# Patient Record
Sex: Male | Born: 1970 | Hispanic: No | Marital: Married | Smoking: Never smoker
Health system: Southern US, Community
[De-identification: ages and names within clinical notes are randomized; demographics above are authoritative.]

---

## 2001-05-26 ENCOUNTER — Emergency Department (HOSPITAL_COMMUNITY): Admission: EM | Admit: 2001-05-26 | Discharge: 2001-05-26 | Payer: Self-pay

## 2002-10-21 ENCOUNTER — Emergency Department (HOSPITAL_COMMUNITY): Admission: EM | Admit: 2002-10-21 | Discharge: 2002-10-22 | Payer: Self-pay

## 2003-02-03 ENCOUNTER — Emergency Department (HOSPITAL_COMMUNITY): Admission: EM | Admit: 2003-02-03 | Discharge: 2003-02-04 | Payer: Self-pay

## 2004-09-30 ENCOUNTER — Other Ambulatory Visit: Payer: Self-pay

## 2004-09-30 ENCOUNTER — Emergency Department: Payer: Self-pay | Admitting: General Practice

## 2008-03-22 ENCOUNTER — Emergency Department: Payer: Self-pay | Admitting: Emergency Medicine

## 2014-02-14 ENCOUNTER — Emergency Department (HOSPITAL_COMMUNITY): Payer: Self-pay

## 2014-02-14 ENCOUNTER — Encounter (HOSPITAL_COMMUNITY): Payer: Self-pay | Admitting: Emergency Medicine

## 2014-02-14 ENCOUNTER — Emergency Department (HOSPITAL_COMMUNITY)
Admission: EM | Admit: 2014-02-14 | Discharge: 2014-02-14 | Disposition: A | Discharge status: Home / Self Care | Payer: Self-pay | Attending: Emergency Medicine | Admitting: Emergency Medicine

## 2014-02-14 DIAGNOSIS — M795 Residual foreign body in soft tissue: Secondary | ICD-10-CM

## 2014-02-14 DIAGNOSIS — Y9389 Activity, other specified: Secondary | ICD-10-CM | POA: Insufficient documentation

## 2014-02-14 DIAGNOSIS — S70351A Superficial foreign body, right thigh, initial encounter: Secondary | ICD-10-CM | POA: Insufficient documentation

## 2014-02-14 DIAGNOSIS — W294XXA Contact with nail gun, initial encounter: Secondary | ICD-10-CM | POA: Insufficient documentation

## 2014-02-14 DIAGNOSIS — Y9289 Other specified places as the place of occurrence of the external cause: Secondary | ICD-10-CM | POA: Insufficient documentation

## 2014-02-14 DIAGNOSIS — Y998 Other external cause status: Secondary | ICD-10-CM | POA: Insufficient documentation

## 2014-02-14 MED ORDER — OXYCODONE-ACETAMINOPHEN 5-325 MG PO TABS: 1.0000 | ORAL_TABLET | Freq: Once | ORAL | Status: DC

## 2014-02-14 MED ORDER — LIDOCAINE HCL (PF) 1 % IJ SOLN
5.0000 mL | Freq: Once | INTRAMUSCULAR | Status: AC
  Administered 2014-02-14: 5 mL
  Filled 2014-02-14: qty 5

## 2014-02-14 MED ORDER — CEPHALEXIN 500 MG PO CAPS: 500.0000 mg | ORAL_CAPSULE | Freq: Four times a day (QID) | ORAL | Status: DC

## 2014-02-14 MED ORDER — HYDROMORPHONE HCL 1 MG/ML IJ SOLN
1.0000 mg | Freq: Once | INTRAMUSCULAR | Status: AC
Start: 2014-02-14 — End: 2014-02-14
  Administered 2014-02-14: 1 mg via INTRAMUSCULAR
  Filled 2014-02-14: qty 1

## 2014-02-14 NOTE — Discharge Instructions (Signed)
Watch for signs of infection. Keep the wound clean with soap and water. Return for fevers or increasing pain. Take 3 days of the antibiotic.

## 2014-02-14 NOTE — ED Notes (Signed)
Pt with approx 2 inch long nail in right upper leg from nail gun; pt sts last TD was 5 years ago
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
## 2015-02-03 ENCOUNTER — Encounter (HOSPITAL_COMMUNITY): Payer: Self-pay | Admitting: Emergency Medicine

## 2015-02-03 ENCOUNTER — Emergency Department (HOSPITAL_COMMUNITY): Payer: No Typology Code available for payment source

## 2015-02-03 ENCOUNTER — Emergency Department (HOSPITAL_COMMUNITY)
Admission: EM | Admit: 2015-02-03 | Discharge: 2015-02-03 | Disposition: A | Discharge status: Home / Self Care | Payer: No Typology Code available for payment source | Attending: Emergency Medicine | Admitting: Emergency Medicine

## 2015-02-03 DIAGNOSIS — S6991XA Unspecified injury of right wrist, hand and finger(s), initial encounter: Secondary | ICD-10-CM

## 2015-02-03 DIAGNOSIS — Y998 Other external cause status: Secondary | ICD-10-CM | POA: Insufficient documentation

## 2015-02-03 DIAGNOSIS — Y92410 Unspecified street and highway as the place of occurrence of the external cause: Secondary | ICD-10-CM | POA: Insufficient documentation

## 2015-02-03 DIAGNOSIS — Y9389 Activity, other specified: Secondary | ICD-10-CM | POA: Insufficient documentation

## 2015-02-03 DIAGNOSIS — Z792 Long term (current) use of antibiotics: Secondary | ICD-10-CM | POA: Insufficient documentation
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
## 2015-12-02 ENCOUNTER — Emergency Department (HOSPITAL_COMMUNITY): Payer: Self-pay

## 2015-12-02 ENCOUNTER — Encounter (HOSPITAL_COMMUNITY): Payer: Self-pay | Admitting: *Deleted

## 2015-12-02 ENCOUNTER — Emergency Department (HOSPITAL_COMMUNITY)
Admission: EM | Admit: 2015-12-02 | Discharge: 2015-12-02 | Disposition: A | Discharge status: Home / Self Care | Payer: Self-pay | Attending: Emergency Medicine | Admitting: Emergency Medicine

## 2015-12-02 DIAGNOSIS — S6992XA Unspecified injury of left wrist, hand and finger(s), initial encounter: Secondary | ICD-10-CM

## 2015-12-02 DIAGNOSIS — W268XXA Contact with other sharp object(s), not elsewhere classified, initial encounter: Secondary | ICD-10-CM | POA: Insufficient documentation

## 2015-12-02 DIAGNOSIS — Y999 Unspecified external cause status: Secondary | ICD-10-CM | POA: Insufficient documentation

## 2015-12-02 DIAGNOSIS — Y939 Activity, unspecified: Secondary | ICD-10-CM | POA: Insufficient documentation

## 2015-12-02 DIAGNOSIS — Y929 Unspecified place or not applicable: Secondary | ICD-10-CM | POA: Insufficient documentation

## 2015-12-02 DIAGNOSIS — S61311A Laceration without foreign body of left index finger with damage to nail, initial encounter: Secondary | ICD-10-CM | POA: Insufficient documentation

## 2015-12-02 MED ORDER — NAPROXEN 500 MG PO TABS: 500.0000 mg | ORAL_TABLET | Freq: Two times a day (BID) | ORAL | 0 refills | Status: DC

## 2015-12-02 MED ORDER — CEPHALEXIN 250 MG PO CAPS
500.0000 mg | ORAL_CAPSULE | Freq: Once | ORAL | Status: AC
  Administered 2015-12-02: 500 mg via ORAL
  Filled 2015-12-02: qty 2

## 2015-12-02 MED ORDER — HYDROCODONE-ACETAMINOPHEN 5-325 MG PO TABS: 1.0000 | ORAL_TABLET | Freq: Four times a day (QID) | ORAL | 0 refills | Status: DC | PRN

## 2015-12-02 MED ORDER — CEPHALEXIN 500 MG PO CAPS: 500.0000 mg | ORAL_CAPSULE | Freq: Four times a day (QID) | ORAL | 0 refills | Status: DC

## 2015-12-02 MED ORDER — BUPIVACAINE HCL (PF) 0.5 % IJ SOLN
10.0000 mL | Freq: Once | INTRAMUSCULAR | Status: AC
  Administered 2015-12-02: 10 mL
  Filled 2015-12-02: qty 10

## 2015-12-02 MED ORDER — LIDOCAINE HCL (PF) 1 % IJ SOLN
5.0000 mL | Freq: Once | INTRAMUSCULAR | Status: AC
  Administered 2015-12-02: 5 mL
  Filled 2015-12-02: qty 5

## 2015-12-02 NOTE — ED Triage Notes (Signed)
Pt cut left index finger with a skill saw. Bleeding controlled.

## 2015-12-02 NOTE — ED Notes (Signed)
Pt departed in NAD, refused use of wheelchair.  

## 2015-12-02 NOTE — ED Notes (Signed)
PA at bedside.

## 2015-12-02 NOTE — ED Provider Notes (Signed)
[facility]-EMERGENCY DEPT Provider Note   CSN: [id number] Arrival date & time: 12/02/15  1600  By signing my name below, I, Phillis Haggis, attest that this documentation has been prepared under the direction and in the presence of [facility], NP-C. Electronically Signed: Phillis Haggis, ED Scribe. 12/02/15. 5:02 PM.  History   Chief Complaint Chief Complaint  Patient presents with  . Finger Injury   The history is provided by the patient. No language interpreter was used.  Laceration   The incident occurred 1 to 2 hours ago. The laceration is located on the left hand. The laceration mechanism was a a metal edge. The pain is moderate. The pain has been constant since onset. His tetanus status is UTD.  HPI Comments: Derrick Carlson is a 45 y.o. male who presents to the Emergency Department complaining of a deep laceration to the left index finger onset two hours ago. Pt reports that he was using a skill saw when it cut through the nailbed of the finger. Bleeding is currently controlled. He is utd on tdap. Pt is right hand dominant. He denies numbness or weakness.  History reviewed. No pertinent past medical history.  There are no active problems to display for this patient.   History reviewed. No pertinent surgical history.   Home Medications    Prior to Admission medications   Medication Sig Start Date End Date Taking? Authorizing Provider  cephALEXin (KEFLEX) 500 MG capsule Take 1 capsule (500 mg total) by mouth 4 (four) times daily. 12/02/15   Anallely Rosell Orlene Och, NP  HYDROcodone-acetaminophen (NORCO) 5-325 MG tablet Take 1 tablet by mouth every 6 (six) hours as needed. 12/02/15   Dulcie Gammon Orlene Och, NP  naproxen (NAPROSYN) 500 MG tablet Take 1 tablet (500 mg total) by mouth 2 (two) times daily. 12/02/15   Kimmy Totten Orlene Och, NP    Family History History reviewed. No pertinent family history.  Social History Social History  Substance Use Topics  . Smoking status: Never Smoker  . Smokeless tobacco:  Never Used  . Alcohol use Yes     Comment: sometimes     Allergies   Review of patient's allergies indicates no known allergies.   Review of Systems Review of Systems  Musculoskeletal: Positive for arthralgias.  Skin: Positive for wound.  Neurological: Negative for weakness and numbness.   Physical Exam Updated Vital Signs BP 140/90 (BP Location: Right Arm)   Pulse 84   Temp 98.6 F (37 C) (Oral)   Resp 18   Ht 5\' 6"  (1.676 m)   Wt 86.2 kg   SpO2 95%   BMI 30.67 kg/m   Physical Exam  Constitutional: He is oriented to person, place, and time. He appears well-developed and well-nourished.  HENT:  Head: Normocephalic and atraumatic.  Eyes: Conjunctivae and EOM are normal. Pupils are equal, round, and reactive to light.  Neck: Normal range of motion. Neck supple.  Musculoskeletal:  Left index finger: There is a laceration and partial fingertip amputation that included the nail and through the nailbed. He has normal strength, full ROM.   Neurological: He is alert and oriented to person, place, and time.  Skin: Skin is warm and dry.  Psychiatric: He has a normal mood and affect. His behavior is normal.  Nursing note and vitals reviewed.  ED Treatments / Results  DIAGNOSTIC STUDIES: Oxygen Saturation is 95% on RA, normal by my interpretation.    COORDINATION OF CARE: 5:00 PM-Discussed treatment plan which includes digital block and consult  to hand with pt at bedside and pt agreed to plan.   7:30 PM-digital block performed and successful. Area was scrubbed with betadine brush and irrigated with 800 CCs of sterile saline. Xeroform dressing was placed by me at this time. Consult to hand surgeon on call.  8:06 PM-consult with Dr.Weingold. He requests that the  Pt call to be seen in his office tomorrow.   Labs (all labs ordered are listed, but only abnormal results are displayed) Labs Reviewed - No data to display  Radiology Dg Hand Complete Left  Result Date:  12/02/2015 CLINICAL DATA:  Caught second digit in steel solid with laceration distally EXAM: LEFT HAND - COMPLETE 3+ VIEW COMPARISON:  None. FINDINGS: There does appear to be soft tissue defect involving the distal aspect of the left second digit, but no fracture or involvement of bone is seen. Unfortunately the lateral view does not allow visualization of the second digit with considerable bony overlap. The radiocarpal joint space appears normal. MCP, PIP, DIP joints appear normal. IMPRESSION: No fracture or opaque foreign body. The lateral view is suboptimal for assessment of the distal phalanx of the left second digit. Electronically Signed   By: Dwyane DeePaul  Barry M.D.   On: 12/02/2015 16:49   Dg Finger Index Left  Result Date: 12/02/2015 CLINICAL DATA:  Recent saw injury to the second digit, initial encounter EXAM: LEFT INDEX FINGER 2+V COMPARISON:  Films from earlier in the same day FINDINGS: Bony structures are within normal limits. Soft tissue irregularity is noted distally consistent with the recent injury. Questionable density is noted within the distal soft tissues similar to that seen on prior exam. Second density is noted adjacent to the proximal interphalangeal joint likely of a chronic nature. IMPRESSION: Questionable foreign body in the distal soft tissues of the second digit. No bony abnormality is seen. Foreign body adjacent to the second PIP joint dorsally likely related to prior injury. Electronically Signed   By: Alcide CleverMark  Lukens M.D.   On: 12/02/2015 17:35    Procedures .Nerve Block Date/Time: 12/02/2015 5:01 PM Performed by: Janne NapoleonNEESE, Mykhia Danish M Authorized by: Janne NapoleonNEESE, Mathews Stuhr M   Consent:    Consent obtained:  Verbal Indications:    Indications:  Pain relief Location:    Body area:  Upper extremity   Upper extremity nerve:  Metacarpal   Laterality:  Left Skin anesthesia (see MAR for exact dosages):    Skin anesthesia method:  None Procedure details (see MAR for exact dosages):    Anesthetic  injected:  Bupivacaine 0.5% w/o epi and lidocaine 1% w/o epi   Paresthesia:  None Post-procedure details:    Patient tolerance of procedure:  Tolerated well, no immediate complications    (including critical care time)  Medications Ordered in ED Medications  bupivacaine (MARCAINE) 0.5 % injection 10 mL (10 mLs Infiltration Given by Other 12/02/15 1753)  lidocaine (PF) (XYLOCAINE) 1 % injection 5 mL (5 mLs Infiltration Given by Other 12/02/15 1753)  cephALEXin (KEFLEX) capsule 500 mg (500 mg Oral Given 12/02/15 2031)   Initial Impression / Assessment and Plan / ED Course  I have reviewed the triage vital signs and the nursing notes.  Pertinent labs & imaging results that were available during my care of the patient were reviewed by me and considered in my medical decision making (see chart for details).  Clinical Course    Final Clinical Impressions(s) / ED Diagnoses  45 y.o. male with injury to his left index finger due to saw injury stable  for d/c to f/ with Dr. Mina Marble in the office in the morning. Discussed with the patient and all questioned fully answered. He voices understanding and agrees with plan.    Final diagnoses:  Injury of tip of finger of left hand, initial encounter   I personally performed the services described in this documentation, which was scribed in my presence. The recorded information has been reviewed and is accurate.   New Prescriptions Discharge Medication List as of 12/02/2015  8:21 PM    START taking these medications   Details  HYDROcodone-acetaminophen (NORCO) 5-325 MG tablet Take 1 tablet by mouth every 6 (six) hours as needed., Starting Wed 12/02/2015, Print    naproxen (NAPROSYN) 500 MG tablet Take 1 tablet (500 mg total) by mouth 2 (two) times daily., Starting Wed 12/02/2015, [address] [place], NP 12/03/15 1610    Alvira Monday, MD 12/03/15 1310

## 2015-12-02 NOTE — Discharge Instructions (Signed)
Call Dr. Ronie SpiesWeingold's office in the morning and tell them that you were evaluated in the ED and that we spoke with Dr. Mina MarbleWeingold and he wants you to come to the office for recheck 12/03/15. (tomorrow) Do not drive while taking the narcotic pain medication as it will make you sleepy.

## 2015-12-21 ENCOUNTER — Encounter (HOSPITAL_COMMUNITY): Payer: Self-pay | Admitting: Emergency Medicine

## 2015-12-21 ENCOUNTER — Emergency Department (HOSPITAL_COMMUNITY)
Admission: EM | Admit: 2015-12-21 | Discharge: 2015-12-21 | Disposition: A | Discharge status: Home / Self Care | Payer: Self-pay | Attending: Emergency Medicine | Admitting: Emergency Medicine

## 2015-12-21 DIAGNOSIS — R112 Nausea with vomiting, unspecified: Secondary | ICD-10-CM | POA: Insufficient documentation

## 2015-12-21 DIAGNOSIS — R1013 Epigastric pain: Secondary | ICD-10-CM | POA: Insufficient documentation

## 2015-12-21 LAB — DIFFERENTIAL
BASOS ABS: 0 10*3/uL (ref 0.0–0.1)
Basophils Relative: 1 %
EOS PCT: 14 %
Eosinophils Absolute: 1.2 10*3/uL — ABNORMAL HIGH (ref 0.0–0.7)
LYMPHS ABS: 2.4 10*3/uL (ref 0.7–4.0)
LYMPHS PCT: 29 %
Monocytes Absolute: 0.8 10*3/uL (ref 0.1–1.0)
Monocytes Relative: 10 %
NEUTROS PCT: 46 %
Neutro Abs: 3.8 10*3/uL (ref 1.7–7.7)

## 2015-12-21 LAB — COMPREHENSIVE METABOLIC PANEL
ALBUMIN: 4.2 g/dL (ref 3.5–5.0)
ALK PHOS: 89 U/L (ref 38–126)
ALT: 69 U/L — ABNORMAL HIGH (ref 17–63)
ANION GAP: 10 (ref 5–15)
AST: 35 U/L (ref 15–41)
BUN: 16 mg/dL (ref 6–20)
CALCIUM: 9.3 mg/dL (ref 8.9–10.3)
CHLORIDE: 101 mmol/L (ref 101–111)
CO2: 25 mmol/L (ref 22–32)
Creatinine, Ser: 1.16 mg/dL (ref 0.61–1.24)
GFR calc non Af Amer: >60 mL/min (ref >60)
Glucose, Bld: 108 mg/dL — ABNORMAL HIGH (ref 65–99)
POTASSIUM: 4 mmol/L (ref 3.5–5.1)
SODIUM: 136 mmol/L (ref 135–145)
Total Bilirubin: 0.5 mg/dL (ref 0.3–1.2)
Total Protein: 7.3 g/dL (ref 6.5–8.1)

## 2015-12-21 LAB — CBC
HEMATOCRIT: 47.2 % (ref 39.0–52.0)
HEMOGLOBIN: 16.2 g/dL (ref 13.0–17.0)
MCH: 30.2 pg (ref 26.0–34.0)
MCHC: 34.3 g/dL (ref 30.0–36.0)
MCV: 88.1 fL (ref 78.0–100.0)
Platelets: 247 10*3/uL (ref 150–400)
RBC: 5.36 MIL/uL (ref 4.22–5.81)
RDW: 13.7 % (ref 11.5–15.5)
WBC: 8.6 10*3/uL (ref 4.0–10.5)

## 2015-12-21 LAB — URINALYSIS, ROUTINE W REFLEX MICROSCOPIC
Bilirubin Urine: NEGATIVE
Glucose, UA: NEGATIVE mg/dL
HGB URINE DIPSTICK: NEGATIVE
Ketones, ur: NEGATIVE mg/dL
Leukocytes, UA: NEGATIVE
Nitrite: NEGATIVE
PH: 6 (ref 5.0–8.0)
Protein, ur: NEGATIVE mg/dL
SPECIFIC GRAVITY, URINE: 1.021 (ref 1.005–1.030)

## 2015-12-21 LAB — LIPASE, BLOOD: LIPASE: 21 U/L (ref 11–51)

## 2015-12-21 MED ORDER — ONDANSETRON HCL 4 MG/2ML IJ SOLN
4.0000 mg | Freq: Once | INTRAMUSCULAR | Status: AC
  Administered 2015-12-21: 4 mg via INTRAVENOUS
  Filled 2015-12-21: qty 2

## 2015-12-21 MED ORDER — MORPHINE SULFATE (PF) 4 MG/ML IV SOLN
4.0000 mg | Freq: Once | INTRAVENOUS | Status: AC
Start: 2015-12-21 — End: 2015-12-21
  Administered 2015-12-21: 4 mg via INTRAVENOUS
  Filled 2015-12-21: qty 1

## 2015-12-21 MED ORDER — OXYCODONE-ACETAMINOPHEN 5-325 MG PO TABS: 1.0000 | ORAL_TABLET | ORAL | 0 refills | Status: DC | PRN

## 2015-12-21 MED ORDER — PANTOPRAZOLE SODIUM 40 MG PO TBEC
40.0000 mg | DELAYED_RELEASE_TABLET | Freq: Once | ORAL | Status: AC
  Administered 2015-12-21: 40 mg via ORAL
  Filled 2015-12-21: qty 1

## 2015-12-21 MED ORDER — MORPHINE SULFATE (PF) 4 MG/ML IV SOLN
4.0000 mg | Freq: Once | INTRAVENOUS | Status: AC
  Administered 2015-12-21: 4 mg via INTRAVENOUS
  Filled 2015-12-21: qty 1

## 2015-12-21 MED ORDER — PANTOPRAZOLE SODIUM 20 MG PO TBEC: 20.0000 mg | DELAYED_RELEASE_TABLET | Freq: Every day | ORAL | 0 refills | Status: DC

## 2015-12-21 MED ORDER — GI COCKTAIL ~~LOC~~
30.0000 mL | Freq: Once | ORAL | Status: AC
  Administered 2015-12-21: 30 mL via ORAL
  Filled 2015-12-21: qty 30

## 2015-12-21 MED ORDER — ONDANSETRON HCL 4 MG PO TABS: 4.0000 mg | ORAL_TABLET | Freq: Four times a day (QID) | ORAL | 0 refills | Status: DC | PRN

## 2015-12-21 NOTE — Discharge Instructions (Signed)
Return to the ED if pain is getting worse.

## 2015-12-21 NOTE — ED Triage Notes (Signed)
Pt presents to ER from home with upper abd pain with vomiting since 8p last night; pt states emesis was yellow; pt denies fever, diarrhea, urinary symptoms;

## 2015-12-21 NOTE — ED Notes (Signed)
Pt and family understood dc material. NAD noted. SCripts given at dc 

## 2015-12-21 NOTE — ED Notes (Signed)
pts family came out and stated he felt better. MD aware

## 2015-12-21 NOTE — ED Provider Notes (Signed)
[facility] Provider Note   CSN: [id number] Arrival date & time: 12/21/15  [id number]  By signing my name below, I, Doreatha MartinEva Mathews, attest that this documentation has been prepared under the direction and in the presence of Dione Boozeavid Prim Morace, MD. Electronically Signed: Doreatha MartinEva Mathews, ED Scribe. 12/21/15. 2:59 AM.     History   Chief Complaint Chief Complaint  Patient presents with  . Abdominal Pain  . Emesis    HPI Derrick Carlson is a 45 y.o. male who presents to the Emergency Department complaining of moderate, 8/10 epigastric abdominal pain onset at 8 pm last night with associated nausea, vomiting. Pt states his pain is worsened with certain movements. No alleviating factors noted. No known sick contacts with similar symptoms. He denies constipation, diarrhea, fever, chills, diaphoresis. Pt is not currently followed by a PCP.    The history is provided by the patient. No language interpreter was used.    History reviewed. No pertinent past medical history.  There are no active problems to display for this patient.   History reviewed. No pertinent surgical history.     Home Medications    Prior to Admission medications   Medication Sig Start Date End Date Taking? Authorizing Provider  cephALEXin (KEFLEX) 500 MG capsule Take 1 capsule (500 mg total) by mouth 4 (four) times daily. 12/02/15   Hope Orlene OchM Neese, NP  HYDROcodone-acetaminophen (NORCO) 5-325 MG tablet Take 1 tablet by mouth every 6 (six) hours as needed. 12/02/15   Hope Orlene OchM Neese, NP  naproxen (NAPROSYN) 500 MG tablet Take 1 tablet (500 mg total) by mouth 2 (two) times daily. 12/02/15   Hope Orlene OchM Neese, NP    Family History History reviewed. No pertinent family history.  Social History Social History  Substance Use Topics  . Smoking status: Never Smoker  . Smokeless tobacco: Never Used  . Alcohol use Yes     Comment: sometimes     Allergies   Review of patient's allergies indicates no known allergies.   Review of  Systems Review of Systems  Constitutional: Negative for chills, diaphoresis and fever.  Gastrointestinal: Positive for abdominal pain, nausea and vomiting. Negative for constipation and diarrhea.  All other systems reviewed and are negative.    Physical Exam Updated Vital Signs BP (!) 146/108 (BP Location: Right Arm)   Pulse 62   Temp 98 F (36.7 C) (Oral)   Resp 18   Ht 5\' 6"  (1.676 m)   Wt 175 lb (79.4 kg)   SpO2 96%   BMI 28.25 kg/m   Physical Exam  Constitutional: He is oriented to person, place, and time. He appears well-developed and well-nourished.  HENT:  Head: Normocephalic and atraumatic.  Eyes: EOM are normal. Pupils are equal, round, and reactive to light.  Neck: Normal range of motion. Neck supple. No JVD present.  Cardiovascular: Normal rate, regular rhythm and normal heart sounds.   No murmur heard. Pulmonary/Chest: Effort normal and breath sounds normal. He has no wheezes. He has no rales. He exhibits no tenderness.  Abdominal: Soft. He exhibits no distension and no mass. There is tenderness.  Mild epigastric tenderness. Bowel sounds decreased.   Musculoskeletal: Normal range of motion. He exhibits no edema.  Lymphadenopathy:    He has no cervical adenopathy.  Neurological: He is alert and oriented to person, place, and time. No cranial nerve deficit. He exhibits normal muscle tone. Coordination normal.  Skin: Skin is warm and dry. No rash noted.  Psychiatric: He has a normal  mood and affect. His behavior is normal. Judgment and thought content normal.  Nursing note and vitals reviewed.    ED Treatments / Results   DIAGNOSTIC STUDIES: Oxygen Saturation is 96% on RA, adequate by my interpretation.    COORDINATION OF CARE: 2:57 AM Discussed treatment plan with pt at bedside which includes lab work and pt agreed to plan.    Labs (all labs ordered are listed, but only abnormal results are displayed) Labs Reviewed  COMPREHENSIVE METABOLIC PANEL -  Abnormal; Notable for the following:       Result Value   Glucose, Bld 108 (*)    ALT 69 (*)    All other components within normal limits  DIFFERENTIAL - Abnormal; Notable for the following:    Eosinophils Absolute 1.2 (*)    All other components within normal limits  LIPASE, BLOOD  CBC  URINALYSIS, ROUTINE W REFLEX MICROSCOPIC (NOT AT [facility])    Procedures Procedures (including critical care time)  Medications Ordered in ED Medications  ondansetron (ZOFRAN) injection 4 mg (4 mg Intravenous Given 12/21/15 0309)  gi cocktail (Maalox,Lidocaine,Donnatal) (30 mLs Oral Given 12/21/15 0309)  morphine 4 MG/ML injection 4 mg (4 mg Intravenous Given 12/21/15 0408)  morphine 4 MG/ML injection 4 mg (4 mg Intravenous Given 12/21/15 0618)  pantoprazole (PROTONIX) EC tablet 40 mg (40 mg Oral Given 12/21/15 0618)     Initial Impression / Assessment and Plan / ED Course  I have reviewed the triage vital signs and the nursing notes.  Pertinent lab results that were available during my care of the patient were reviewed by me and considered in my medical decision making (see chart for details).  Clinical Course   Nausea, vomiting, epigastric pain. Symptoms are suggestive of viral enteritis. There may be some component of GERD. Old records are reviewed, and he has no relevant past visits. He is given ondansetron and a GI cocktail with slight improvement but it continued to complain of epigastric pain. He was given 2 doses of morphine with significant relief of pain. Laboratory workup is reassuring. He is discharged with prescriptions for pantoprazole and ondansetron. Also given a prescription for a small number of oxycodone-acetaminophen. Return precautions given.  Final Clinical Impressions(s) / ED Diagnoses   Final diagnoses:  Epigastric pain  Non-intractable vomiting with nausea, unspecified vomiting type    New Prescriptions Discharge Medication List as of 12/21/2015  6:53 AM    START  taking these medications   Details  ondansetron (ZOFRAN) 4 MG tablet Take 1 tablet (4 mg total) by mouth every 6 (six) hours as needed for nausea or vomiting., Starting Mon 12/21/2015, Print    oxyCODONE-acetaminophen (PERCOCET) 5-325 MG tablet Take 1 tablet by mouth every 4 (four) hours as needed for moderate pain., Starting Mon 12/21/2015, Print    pantoprazole (PROTONIX) 20 MG tablet Take 1 tablet (20 mg total) by mouth daily., Starting Mon 12/21/2015, Print        I personally performed the services described in this documentation, which was scribed in my presence. The recorded information has been reviewed and is accurate.       Dione Booze, MD 12/21/15 [phone]

## 2017-07-28 ENCOUNTER — Emergency Department (HOSPITAL_COMMUNITY)
Admission: EM | Admit: 2017-07-28 | Discharge: 2017-07-28 | Disposition: A | Discharge status: Home / Self Care | Payer: Self-pay | Attending: Emergency Medicine | Admitting: Emergency Medicine

## 2017-07-28 ENCOUNTER — Encounter (HOSPITAL_COMMUNITY): Payer: Self-pay | Admitting: Emergency Medicine

## 2017-07-28 DIAGNOSIS — B029 Zoster without complications: Secondary | ICD-10-CM | POA: Insufficient documentation

## 2017-07-28 MED ORDER — ACYCLOVIR 400 MG PO TABS: 400.0000 mg | ORAL_TABLET | Freq: Every day | ORAL | 0 refills | Status: DC

## 2017-07-28 MED ORDER — NAPROXEN 375 MG PO TABS: 375.0000 mg | ORAL_TABLET | Freq: Two times a day (BID) | ORAL | 0 refills | Status: DC

## 2017-07-28 NOTE — ED Triage Notes (Signed)
Reports a rash to left buttock for the last three days.  Reports small blister like area that busted and is painful.  Taking tylenol with no relief of pain.

## 2017-07-28 NOTE — ED Provider Notes (Signed)
[facility] EMERGENCY DEPARTMENT Provider Note   CSN: [id number] Arrival date & time: 07/28/17  2129     History   Chief Complaint Chief Complaint  Patient presents with  . Rash    HPI Derrick Carlson Derrick Carlson is a 47 y.o. male with no significant past medical history presents emergency department today for rash to his left buttock over the last 3 days.  Patient reports that 3 days ago he noticed a rash on the left side of his buttocks that does not cross midline and reports that they were vesicular-like blisters that have now popped and scabbed over.  He notes that the area is not pruritic. He has not tried anything for this. Denies fever, chills, contacts with persons with similar rash, or any changes in lotions/soaps/detergents, exposure to animal or plant irritants. Denies swelling or purulent discharge. No new medications. No recent travel. No recent tick bites. No involvement to palms/soles or between webspaces. Patient does not have history of  Immunocompromise.   HPI  History reviewed. No pertinent past medical history.  There are no active problems to display for this patient.   History reviewed. No pertinent surgical history.      Home Medications    Prior to Admission medications   Medication Sig Start Date End Date Taking? Authorizing Provider  acyclovir (ZOVIRAX) 400 MG tablet Take 1 tablet (400 mg total) by mouth 5 (five) times daily. 07/28/17   Kerline Trahan, Elmer Sow, PA-C  naproxen (NAPROSYN) 375 MG tablet Take 1 tablet (375 mg total) by mouth 2 (two) times daily. 07/28/17   Adream Parzych, Elmer Sow, PA-C  ondansetron (ZOFRAN) 4 MG tablet Take 1 tablet (4 mg total) by mouth every 6 (six) hours as needed for nausea or vomiting. 12/21/15   Dione Booze, MD  oxyCODONE-acetaminophen (PERCOCET) 5-325 MG tablet Take 1 tablet by mouth every 4 (four) hours as needed for moderate pain. 12/21/15   Dione Booze, MD  pantoprazole (PROTONIX) 20 MG tablet Take 1 tablet (20 mg  total) by mouth daily. 12/21/15   Dione Booze, MD    Family History No family history on file.  Social History Social History   Tobacco Use  . Smoking status: Never Smoker  . Smokeless tobacco: Never Used  Substance Use Topics  . Alcohol use: Yes    Comment: sometimes  . Drug use: No     Allergies   Patient has no known allergies.   Review of Systems Review of Systems  All other systems reviewed and are negative.    Physical Exam Updated Vital Signs BP (!) 135/99 (BP Location: Right Arm)   Pulse (!) 59   Temp 98.3 F (36.8 C) (Oral)   Resp 18   Ht  (1.626 m)   Wt 86.2 kg (190 lb)   SpO2 96%   BMI 32.61 kg/m   Physical Exam  Constitutional: He appears well-developed and well-nourished.  HENT:  Head: Normocephalic and atraumatic.  Right Ear: External ear normal.  Left Ear: External ear normal.  Nose: Nose normal.  Mouth/Throat: Uvula is midline, oropharynx is clear and moist and mucous membranes are normal. No tonsillar exudate.  No sloughing of the lips.  No lip swelling, tongue swelling or uvular swelling.  No lesions of the oropharynx.  Patient with normal phonation.  In control of secretions.  No trismus.  Eyes: Pupils are equal, round, and reactive to light. Right eye exhibits no discharge. Left eye exhibits no discharge. No scleral icterus.  Neck:  Trachea normal. Neck supple. No spinous process tenderness present. No neck rigidity. Normal range of motion present.  Cardiovascular: Normal rate, regular rhythm and intact distal pulses.  No murmur heard. Pulses:      Radial pulses are 2+ on the right side, and 2+ on the left side.       Dorsalis pedis pulses are 2+ on the right side, and 2+ on the left side.       Posterior tibial pulses are 2+ on the right side, and 2+ on the left side.  No lower extremity swelling or edema. Calves symmetric in size bilaterally.  Pulmonary/Chest: Effort normal and breath sounds normal. He exhibits no tenderness.    Abdominal: Soft. Bowel sounds are normal. There is no tenderness. There is no rebound and no guarding.  Musculoskeletal: He exhibits no edema.  Lymphadenopathy:    He has no cervical adenopathy.  Neurological: He is alert.  Normal range of motion of all 4 extremities with good sensation to all 4 extremities.  Skin: Skin is warm and dry. No rash noted. He is not diaphoretic.  Patient with tightly grouped clear vesicles on erythematous base of the left buttocks that follow a L5 distribution and do not cross midline.  Head to toe inspection with penlight without visualization of further rash.  This peers contained to one area. No blisters, pustules, no warmth, no draining sinus tracts, no superficial abscesses, no bullous impetigo,  no desquamation, no target lesions with dusky purpura or a central bulla. Not tender to touch.  No involvement of the palms or soles.  Psychiatric: He has a normal mood and affect.  Nursing note and vitals reviewed.    ED Treatments / Results  Labs (all labs ordered are listed, but only abnormal results are displayed) Labs Reviewed - No data to display  EKG None  Radiology No results found.  Procedures Procedures (including critical care time)  Medications Ordered in ED Medications - No data to display   Initial Impression / Assessment and Plan / ED Course  I have reviewed the triage vital signs and the nursing notes.  Pertinent labs & imaging results that were available during my care of the patient were reviewed by me and considered in my medical decision making (see chart for details).     47 y.o. male with rash to left buttock x 3 das.Rash is tender with grouped clear vesicles on an erythematous base located along dermatome of L5 unilaterally on the left.   Pt without signs of CNS involvement and there is no involvement of the face or eyes; no concern for opthalmic zoster. No concern for superimposed infection. No concern for SJS, TEN, TSS, tick  borne illness, syphilis. Will discharge home with pain management and acyclovir.  Also recommend calamine lotion and cool compresses as needed for pain control. I advised the patient to follow-up with PCP this week. Specific return precautions discussed. Time was given for all questions to be answered. The patient verbalized understanding and agreement with plan. The patient appears safe for discharge home.  Final Clinical Impressions(s) / ED Diagnoses   Final diagnoses:  Herpes zoster without complication    ED Discharge Orders        Ordered    acyclovir (ZOVIRAX) 400 MG tablet  5 times daily     07/28/17 2205    naproxen (NAPROSYN) 375 MG tablet  2 times daily     07/28/17 2205       Cory Kitt, Elmer Sow, [place]  07/29/17 0133    Little, Ambrose Finland, MD 08/01/17 [phone]

## 2017-07-28 NOTE — Discharge Instructions (Signed)
Follow attached handout.  Take medications as prescribed.  You can use calamine lotion and cool compresses for further relief.  Follow up with PCP in 2 days.  If you develop worsening or new concerning symptoms you can return to the emergency department for re-evaluation.

## 2018-01-17 ENCOUNTER — Ambulatory Visit (HOSPITAL_COMMUNITY)
Admission: EM | Admit: 2018-01-17 | Discharge: 2018-01-17 | Disposition: A | Discharge status: Home / Self Care | Payer: Self-pay | Attending: Family Medicine | Admitting: Family Medicine

## 2018-01-17 ENCOUNTER — Encounter (HOSPITAL_COMMUNITY): Payer: Self-pay | Admitting: Emergency Medicine

## 2018-01-17 DIAGNOSIS — J189 Pneumonia, unspecified organism: Secondary | ICD-10-CM

## 2018-01-17 DIAGNOSIS — J181 Lobar pneumonia, unspecified organism: Secondary | ICD-10-CM

## 2018-01-17 MED ORDER — CEFTRIAXONE SODIUM 1 G IJ SOLR
INTRAMUSCULAR | Status: AC
  Filled 2018-01-17: qty 10

## 2018-01-17 MED ORDER — AZITHROMYCIN 250 MG PO TABS: 250.0000 mg | ORAL_TABLET | Freq: Every day | ORAL | 0 refills | Status: DC

## 2018-01-17 MED ORDER — LIDOCAINE HCL (PF) 2 % IJ SOLN
INTRAMUSCULAR | Status: AC
  Filled 2018-01-17: qty 2

## 2018-01-17 MED ORDER — HYDROCOD POLST-CPM POLST ER 10-8 MG/5ML PO SUER: 5.0000 mL | Freq: Two times a day (BID) | ORAL | 0 refills | Status: DC | PRN

## 2018-01-17 MED ORDER — CEFTRIAXONE SODIUM 1 G IJ SOLR
1.0000 g | Freq: Once | INTRAMUSCULAR | Status: AC
  Administered 2018-01-17: 1 g via INTRAMUSCULAR

## 2018-01-17 NOTE — ED Provider Notes (Signed)
[facility]    CSN: [id number] Arrival date & time: 01/17/18  1845     History   Chief Complaint Chief Complaint  Patient presents with  . Fever  . Cough    HPI Cotton Beckley [name] is a 47 y.o. male.   Pt c/o cold symptoms, fever x 3 days.  This is the patient's first visit to [facility] urgent care.  Patient just cannot stop coughing and is running a fever that is not being well controlled with Tylenol or Advil.  He works [employer].  He has had no vomiting or diarrhea or abdominal pain.  Patient denies shortness of breath     History reviewed. No pertinent past medical history.  There are no active problems to display for this patient.   History reviewed. No pertinent surgical history.     Home Medications    Prior to Admission medications   Medication Sig Start Date End Date Taking? Authorizing Provider  azithromycin (ZITHROMAX) 250 MG tablet Take 1 tablet (250 mg total) by mouth daily. Take first 2 tablets together, then 1 every day until finished. 01/17/18   Elvina Sidle, MD  chlorpheniramine-HYDROcodone (TUSSIONEX PENNKINETIC ER) 10-8 MG/5ML SUER Take 5 mLs by mouth every 12 (twelve) hours as needed for cough. 01/17/18   Elvina Sidle, MD  pantoprazole (PROTONIX) 20 MG tablet Take 1 tablet (20 mg total) by mouth daily. 12/21/15   Dione Booze, MD    Family History No family history on file.  Social History Social History   Tobacco Use  . Smoking status: Never Smoker  . Smokeless tobacco: Never Used  Substance Use Topics  . Alcohol use: Yes    Comment: sometimes  . Drug use: No     Allergies   Patient has no known allergies.   Review of Systems Review of Systems  Constitutional: Positive for fatigue and fever.  HENT: Positive for sore throat.   Respiratory: Positive for cough. Negative for shortness of breath.   Cardiovascular: Negative.   Musculoskeletal: Positive for myalgias.  Neurological: Positive for headaches.      Physical Exam Triage Vital Signs ED Triage Vitals  Enc Vitals Group     BP 01/17/18 1929 137/90     Pulse Rate 01/17/18 1929 100     Resp 01/17/18 1929 16     Temp 01/17/18 1929 (!) 101.4 F (38.6 C)     Temp Source 01/17/18 1929 Oral     SpO2 01/17/18 1929 95 %     Weight --      Height --      Head Circumference --      Peak Flow --      Pain Score 01/17/18 1930 10     Pain Loc --      Pain Edu? --      Excl. in GC? --    No data found.  Updated Vital Signs BP 137/90   Pulse 100   Temp (!) 101.4 F (38.6 C) (Oral)   Resp 16   SpO2 95%    Physical Exam  Constitutional: He is oriented to person, place, and time. He appears well-developed and well-nourished.  HENT:  Head: Normocephalic.  Right Ear: External ear normal.  Left Ear: External ear normal.  Mouth/Throat: Oropharynx is clear and moist.  Eyes:  Right exophoria  Neck: Normal range of motion. Neck supple.  Cardiovascular: Normal rate, regular rhythm and normal heart sounds.  Pulmonary/Chest: Effort normal. He has rales.  Left lower  lobe rales  Musculoskeletal: Normal range of motion.  Neurological: He is alert and oriented to person, place, and time.  Skin: Skin is warm and dry.  Psychiatric: He has a normal mood and affect.  Nursing note and vitals reviewed.    UC Treatments / Results  Labs (all labs ordered are listed, but only abnormal results are displayed) Labs Reviewed - No data to display  EKG None  Radiology No results found.  Procedures Procedures (including critical care time)  Medications Ordered in UC Medications  cefTRIAXone (ROCEPHIN) injection 1 g (has no administration in time range)    Initial Impression / Assessment and Plan / UC Course  I have reviewed the triage vital signs and the nursing notes.  Pertinent labs & imaging results that were available during my care of the patient were reviewed by me and considered in my medical decision making (see chart for  details).    Final Clinical Impressions(s) / UC Diagnoses   Final diagnoses:  Pneumonia of left lower lobe due to infectious organism [facility](HCC)   Discharge Instructions   None    ED Prescriptions    Medication Sig Dispense Auth. Provider   chlorpheniramine-HYDROcodone (TUSSIONEX PENNKINETIC ER) 10-8 MG/5ML SUER Take 5 mLs by mouth every 12 (twelve) hours as needed for cough. 75 mL Elvina SidleLauenstein, Shondale Quinley, MD   azithromycin (ZITHROMAX) 250 MG tablet Take 1 tablet (250 mg total) by mouth daily. Take first 2 tablets together, then 1 every day until finished. 6 tablet Elvina SidleLauenstein, Elmond Poehlman, MD     Controlled Substance Prescriptions [place] Controlled Substance Registry consulted? No   Elvina SidleLauenstein, Gerhard Rappaport, MD 01/17/18 2009

## 2018-01-17 NOTE — ED Triage Notes (Addendum)
Pt c/o cold symptoms, fever x5 days. Pt wants to wait for provider before giving medicine.

## 2018-12-25 ENCOUNTER — Inpatient Hospital Stay (HOSPITAL_COMMUNITY)
Admission: EM | Admit: 2018-12-25 | Discharge: 2018-12-26 | DRG: 909 | Disposition: A | Discharge status: Home / Self Care | Payer: Self-pay | Attending: Orthopedic Surgery | Admitting: Orthopedic Surgery

## 2018-12-25 ENCOUNTER — Encounter (HOSPITAL_COMMUNITY)
Admission: EM | Disposition: A | Discharge status: Home / Self Care | Payer: Self-pay | Source: Home / Self Care | Attending: Orthopedic Surgery

## 2018-12-25 ENCOUNTER — Emergency Department (HOSPITAL_COMMUNITY): Payer: Self-pay | Admitting: Certified Registered"

## 2018-12-25 ENCOUNTER — Emergency Department (HOSPITAL_COMMUNITY): Payer: Self-pay

## 2018-12-25 ENCOUNTER — Other Ambulatory Visit: Payer: Self-pay

## 2018-12-25 ENCOUNTER — Encounter (HOSPITAL_COMMUNITY): Payer: Self-pay | Admitting: Emergency Medicine

## 2018-12-25 DIAGNOSIS — S8990XA Unspecified injury of unspecified lower leg, initial encounter: Secondary | ICD-10-CM

## 2018-12-25 DIAGNOSIS — S80252A Superficial foreign body, left knee, initial encounter: Secondary | ICD-10-CM | POA: Diagnosis present

## 2018-12-25 DIAGNOSIS — W294XXA Contact with nail gun, initial encounter: Secondary | ICD-10-CM

## 2018-12-25 DIAGNOSIS — Z20828 Contact with and (suspected) exposure to other viral communicable diseases: Secondary | ICD-10-CM | POA: Diagnosis present

## 2018-12-25 DIAGNOSIS — Z23 Encounter for immunization: Secondary | ICD-10-CM

## 2018-12-25 DIAGNOSIS — S81042A Puncture wound with foreign body, left knee, initial encounter: Principal | ICD-10-CM | POA: Diagnosis present

## 2018-12-25 DIAGNOSIS — Z79899 Other long term (current) drug therapy: Secondary | ICD-10-CM

## 2018-12-25 HISTORY — PX: KNEE ARTHROSCOPY: SHX127

## 2018-12-25 LAB — CBC WITH DIFFERENTIAL/PLATELET
Abs Immature Granulocytes: 0.04 10*3/uL (ref 0.00–0.07)
Basophils Absolute: 0 10*3/uL (ref 0.0–0.1)
Basophils Relative: 0 %
Eosinophils Absolute: 0.1 10*3/uL (ref 0.0–0.5)
Eosinophils Relative: 1 %
HCT: 43.7 % (ref 39.0–52.0)
Hemoglobin: 14.7 g/dL (ref 13.0–17.0)
Immature Granulocytes: 0 %
Lymphocytes Relative: 13 %
Lymphs Abs: 1.3 10*3/uL (ref 0.7–4.0)
MCH: 30.3 pg (ref 26.0–34.0)
MCHC: 33.6 g/dL (ref 30.0–36.0)
MCV: 90.1 fL (ref 80.0–100.0)
Monocytes Absolute: 0.5 10*3/uL (ref 0.1–1.0)
Monocytes Relative: 5 %
Neutro Abs: 7.8 10*3/uL — ABNORMAL HIGH (ref 1.7–7.7)
Neutrophils Relative %: 81 %
Platelets: 233 10*3/uL (ref 150–400)
RBC: 4.85 MIL/uL (ref 4.22–5.81)
RDW: 13 % (ref 11.5–15.5)
WBC: 9.8 10*3/uL (ref 4.0–10.5)
nRBC: 0 % (ref 0.0–0.2)

## 2018-12-25 LAB — BASIC METABOLIC PANEL
Anion gap: 10 (ref 5–15)
BUN: 16 mg/dL (ref 6–20)
CO2: 22 mmol/L (ref 22–32)
Calcium: 9.1 mg/dL (ref 8.9–10.3)
Chloride: 105 mmol/L (ref 98–111)
Creatinine, Ser: 1.13 mg/dL (ref 0.61–1.24)
GFR calc Af Amer: >60 mL/min (ref >60)
GFR calc non Af Amer: >60 mL/min (ref >60)
Glucose, Bld: 131 mg/dL — ABNORMAL HIGH (ref 70–99)
Potassium: 3.8 mmol/L (ref 3.5–5.1)
Sodium: 137 mmol/L (ref 135–145)

## 2018-12-25 LAB — SARS CORONAVIRUS 2 BY RT PCR (HOSPITAL ORDER, PERFORMED IN ~~LOC~~ HOSPITAL LAB): SARS Coronavirus 2: NEGATIVE

## 2018-12-25 SURGERY — ARTHROSCOPY, KNEE
Anesthesia: General | Site: Knee | Laterality: Left

## 2018-12-25 MED ORDER — HYDROMORPHONE HCL 1 MG/ML IJ SOLN
1.0000 mg | Freq: Once | INTRAMUSCULAR | Status: AC
  Administered 2018-12-25: 1 mg via INTRAVENOUS
  Filled 2018-12-25: qty 1

## 2018-12-25 MED ORDER — SODIUM CHLORIDE 0.9 % IR SOLN
Status: DC | PRN
  Administered 2018-12-25 (×4): 3000 mL

## 2018-12-25 MED ORDER — ACETAMINOPHEN 10 MG/ML IV SOLN
1000.0000 mg | Freq: Once | INTRAVENOUS | Status: DC | PRN
  Administered 2018-12-26: 1000 mg via INTRAVENOUS

## 2018-12-25 MED ORDER — SUCCINYLCHOLINE CHLORIDE 200 MG/10ML IV SOSY
PREFILLED_SYRINGE | INTRAVENOUS | Status: DC | PRN
  Administered 2018-12-25: 120 mg via INTRAVENOUS

## 2018-12-25 MED ORDER — LACTATED RINGERS IV SOLN
INTRAVENOUS | Status: DC | PRN
  Administered 2018-12-25 (×2): via INTRAVENOUS

## 2018-12-25 MED ORDER — DEXAMETHASONE SODIUM PHOSPHATE 10 MG/ML IJ SOLN
INTRAMUSCULAR | Status: DC | PRN
  Administered 2018-12-25: 10 mg via INTRAVENOUS

## 2018-12-25 MED ORDER — CEFAZOLIN SODIUM-DEXTROSE 2-4 GM/100ML-% IV SOLN
INTRAVENOUS | Status: AC
  Filled 2018-12-25: qty 100

## 2018-12-25 MED ORDER — HYDROMORPHONE HCL 1 MG/ML IJ SOLN
0.2500 mg | INTRAMUSCULAR | Status: DC | PRN
  Administered 2018-12-26 (×2): 0.5 mg via INTRAVENOUS

## 2018-12-25 MED ORDER — ONDANSETRON HCL 4 MG/2ML IJ SOLN
4.0000 mg | Freq: Once | INTRAMUSCULAR | Status: AC
  Administered 2018-12-25: 18:00:00 4 mg via INTRAVENOUS
  Filled 2018-12-25: qty 2

## 2018-12-25 MED ORDER — LIDOCAINE 2% (20 MG/ML) 5 ML SYRINGE
INTRAMUSCULAR | Status: DC | PRN
  Administered 2018-12-25: 40 mg via INTRAVENOUS

## 2018-12-25 MED ORDER — SODIUM CHLORIDE 0.9 % IV BOLUS
1000.0000 mL | Freq: Once | INTRAVENOUS | Status: AC
  Administered 2018-12-25: 18:00:00 1000 mL via INTRAVENOUS

## 2018-12-25 MED ORDER — ACETAMINOPHEN 160 MG/5ML PO SOLN: 325.0000 mg | Freq: Once | ORAL | Status: DC | PRN

## 2018-12-25 MED ORDER — METHOCARBAMOL 500 MG PO TABS: 500.0000 mg | ORAL_TABLET | Freq: Four times a day (QID) | ORAL | 1 refills | Status: AC | PRN

## 2018-12-25 MED ORDER — MEPERIDINE HCL 25 MG/ML IJ SOLN: 6.2500 mg | INTRAMUSCULAR | Status: DC | PRN

## 2018-12-25 MED ORDER — PROPOFOL 10 MG/ML IV BOLUS
INTRAVENOUS | Status: DC | PRN
  Administered 2018-12-25: 130 mg via INTRAVENOUS

## 2018-12-25 MED ORDER — SUGAMMADEX SODIUM 200 MG/2ML IV SOLN
INTRAVENOUS | Status: DC | PRN
  Administered 2018-12-25 (×4): 50 mg via INTRAVENOUS

## 2018-12-25 MED ORDER — MORPHINE SULFATE (PF) 4 MG/ML IV SOLN
4.0000 mg | Freq: Once | INTRAVENOUS | Status: AC
  Administered 2018-12-25: 17:00:00 4 mg via INTRAVENOUS
  Filled 2018-12-25: qty 1

## 2018-12-25 MED ORDER — BUPIVACAINE-EPINEPHRINE 0.5% -1:200000 IJ SOLN
INTRAMUSCULAR | Status: AC
  Filled 2018-12-25: qty 1

## 2018-12-25 MED ORDER — MIDAZOLAM HCL 2 MG/2ML IJ SOLN
INTRAMUSCULAR | Status: DC | PRN
  Administered 2018-12-25: 2 mg via INTRAVENOUS

## 2018-12-25 MED ORDER — 0.9 % SODIUM CHLORIDE (POUR BTL) OPTIME
TOPICAL | Status: DC | PRN
  Administered 2018-12-25: 1000 mL

## 2018-12-25 MED ORDER — CEFAZOLIN SODIUM-DEXTROSE 2-4 GM/100ML-% IV SOLN
2.0000 g | INTRAVENOUS | Status: AC
  Administered 2018-12-25: 2 g via INTRAVENOUS

## 2018-12-25 MED ORDER — OXYCODONE-ACETAMINOPHEN 5-325 MG PO TABS: 1.0000 | ORAL_TABLET | ORAL | 0 refills | Status: AC | PRN

## 2018-12-25 MED ORDER — PROMETHAZINE HCL 25 MG/ML IJ SOLN: 6.2500 mg | INTRAMUSCULAR | Status: DC | PRN

## 2018-12-25 MED ORDER — ROCURONIUM BROMIDE 100 MG/10ML IV SOLN
INTRAVENOUS | Status: DC | PRN
  Administered 2018-12-25: 50 mg via INTRAVENOUS

## 2018-12-25 MED ORDER — CHLORHEXIDINE GLUCONATE 4 % EX LIQD
60.0000 mL | Freq: Once | CUTANEOUS | Status: DC
  Filled 2018-12-25: qty 60

## 2018-12-25 MED ORDER — PHENYLEPHRINE HCL-NACL 10-0.9 MG/250ML-% IV SOLN
INTRAVENOUS | Status: DC | PRN
  Administered 2018-12-25: 25 ug/min via INTRAVENOUS

## 2018-12-25 MED ORDER — LACTATED RINGERS IV SOLN: INTRAVENOUS | Status: DC

## 2018-12-25 MED ORDER — HYDROMORPHONE HCL 1 MG/ML IJ SOLN: 1.0000 mg | Freq: Once | INTRAMUSCULAR | Status: DC

## 2018-12-25 MED ORDER — ASPIRIN 81 MG PO CHEW: 81.0000 mg | CHEWABLE_TABLET | Freq: Two times a day (BID) | ORAL | 0 refills | Status: AC

## 2018-12-25 MED ORDER — METHOCARBAMOL 1000 MG/10ML IJ SOLN: 500.0000 mg | Freq: Four times a day (QID) | INTRAVENOUS | Status: DC | PRN

## 2018-12-25 MED ORDER — ACETAMINOPHEN 325 MG PO TABS: 325.0000 mg | ORAL_TABLET | Freq: Once | ORAL | Status: DC | PRN

## 2018-12-25 MED ORDER — SUFENTANIL CITRATE 50 MCG/ML IV SOLN
INTRAVENOUS | Status: DC | PRN
  Administered 2018-12-25: 10 ug via INTRAVENOUS
  Administered 2018-12-25: 5 ug via INTRAVENOUS
  Administered 2018-12-25: 10 ug via INTRAVENOUS
  Administered 2018-12-25 (×2): 5 ug via INTRAVENOUS

## 2018-12-25 MED ORDER — ONDANSETRON HCL 4 MG/2ML IJ SOLN
INTRAMUSCULAR | Status: DC | PRN
  Administered 2018-12-25: 4 mg via INTRAVENOUS

## 2018-12-25 MED ORDER — TETANUS-DIPHTH-ACELL PERTUSSIS 5-2.5-18.5 LF-MCG/0.5 IM SUSP
0.5000 mL | Freq: Once | INTRAMUSCULAR | Status: AC
  Administered 2018-12-25: 17:00:00 0.5 mL via INTRAMUSCULAR
  Filled 2018-12-25: qty 0.5

## 2018-12-25 MED ORDER — CEFAZOLIN SODIUM-DEXTROSE 1-4 GM/50ML-% IV SOLN
1.0000 g | Freq: Once | INTRAVENOUS | Status: AC
  Administered 2018-12-25: 1 g via INTRAVENOUS
  Filled 2018-12-25: qty 50

## 2018-12-25 MED ORDER — METHOCARBAMOL 500 MG PO TABS
500.0000 mg | ORAL_TABLET | Freq: Four times a day (QID) | ORAL | Status: DC | PRN
  Administered 2018-12-26 (×2): 500 mg via ORAL
  Filled 2018-12-25 (×2): qty 1

## 2018-12-25 MED ORDER — SODIUM CHLORIDE 0.9 % IV SOLN
INTRAVENOUS | Status: DC
  Administered 2018-12-26: 01:00:00 via INTRAVENOUS

## 2018-12-25 SURGICAL SUPPLY — 53 items
BLADE CUTTER GATOR 3.5 (BLADE) ×1 IMPLANT
BLADE EXCALIBUR 4.0MM X 13CM (MISCELLANEOUS) ×1
BLADE EXCALIBUR 4.0X13 (MISCELLANEOUS) ×1 IMPLANT
BLADE SURG 11 STRL SS (BLADE) ×3 IMPLANT
BNDG COHESIVE 6X5 TAN STRL LF (GAUZE/BANDAGES/DRESSINGS) ×3 IMPLANT
BNDG ELASTIC 6X10 VLCR STRL LF (GAUZE/BANDAGES/DRESSINGS) ×1 IMPLANT
BNDG ELASTIC 6X5.8 VLCR STR LF (GAUZE/BANDAGES/DRESSINGS) ×2 IMPLANT
BNDG GAUZE ELAST 4 BULKY (GAUZE/BANDAGES/DRESSINGS) ×4 IMPLANT
CLOSURE WOUND 1/2 X4 (GAUZE/BANDAGES/DRESSINGS)
COVER WAND RF STERILE (DRAPES) ×3 IMPLANT
DRAPE ARTHROSCOPY W/POUCH 114 (DRAPES) ×1 IMPLANT
DRAPE C-ARM 42X72 X-RAY (DRAPES) ×2 IMPLANT
DRAPE HALF SHEET 40X57 (DRAPES) ×2 IMPLANT
DRSG ADAPTIC 3X8 NADH LF (GAUZE/BANDAGES/DRESSINGS) ×2 IMPLANT
DURAPREP 26ML APPLICATOR (WOUND CARE) ×3 IMPLANT
ELECT MENISCUS 165MM 90D (ELECTRODE) IMPLANT
ELECT REM PT RETURN 9FT ADLT (ELECTROSURGICAL) ×3
ELECTRODE REM PT RTRN 9FT ADLT (ELECTROSURGICAL) ×1 IMPLANT
EVACUATOR 1/8 PVC DRAIN (DRAIN) ×2 IMPLANT
GAUZE SPONGE 4X4 12PLY STRL (GAUZE/BANDAGES/DRESSINGS) IMPLANT
GAUZE SPONGE 4X4 12PLY STRL LF (GAUZE/BANDAGES/DRESSINGS) ×2 IMPLANT
GLOVE BIOGEL PI ORTHO PRO SZ8 (GLOVE) ×2
GLOVE PI ORTHO PRO STRL SZ8 (GLOVE) ×1 IMPLANT
GLOVE SURG ORTHO 8.5 STRL (GLOVE) ×3 IMPLANT
GOWN STRL REUS W/ TWL XL LVL3 (GOWN DISPOSABLE) ×2 IMPLANT
GOWN STRL REUS W/TWL XL LVL3 (GOWN DISPOSABLE) ×4
IMMOBILIZER KNEE 20 (SOFTGOODS) ×3
IMMOBILIZER KNEE 20 THIGH 36 (SOFTGOODS) IMPLANT
KIT BASIN OR (CUSTOM PROCEDURE TRAY) ×3 IMPLANT
KIT TURNOVER KIT B (KITS) ×3 IMPLANT
MANIFOLD NEPTUNE II (INSTRUMENTS) ×3 IMPLANT
NDL SPNL 18GX3.5 QUINCKE PK (NEEDLE) ×1 IMPLANT
NEEDLE SPNL 18GX3.5 QUINCKE PK (NEEDLE) ×3 IMPLANT
PACK ARTHROSCOPY DSU (CUSTOM PROCEDURE TRAY) ×3 IMPLANT
PAD ARMBOARD 7.5X6 YLW CONV (MISCELLANEOUS) ×6 IMPLANT
PENCIL BUTTON HOLSTER BLD 10FT (ELECTRODE) ×2 IMPLANT
SPONGE LAP 18X18 RF (DISPOSABLE) ×3 IMPLANT
STRIP CLOSURE SKIN 1/2X4 (GAUZE/BANDAGES/DRESSINGS) ×1 IMPLANT
SUCTION FRAZIER HANDLE 10FR (MISCELLANEOUS) ×2
SUCTION TUBE FRAZIER 10FR DISP (MISCELLANEOUS) IMPLANT
SUT ETHILON 2 0 FS 18 (SUTURE) ×2 IMPLANT
SUT ETHILON 2 0 PSLX (SUTURE) ×2 IMPLANT
SUT MNCRL AB 4-0 PS2 18 (SUTURE) ×3 IMPLANT
SUT PDS AB 0 CT 36 (SUTURE) ×2 IMPLANT
SUT VIC AB 2-0 CT1 (SUTURE) ×2 IMPLANT
TOWEL GREEN STERILE FF (TOWEL DISPOSABLE) ×6 IMPLANT
TUBE CONNECTING 12'X1/4 (SUCTIONS) ×1
TUBE CONNECTING 12X1/4 (SUCTIONS) ×2 IMPLANT
TUBING ARTHROSCOPY IRRIG 16FT (MISCELLANEOUS) ×3 IMPLANT
WAND STAR VAC 90 (SURGICAL WAND) IMPLANT
WATER STERILE IRR 1000ML POUR (IV SOLUTION) ×3 IMPLANT
WRAP KNEE MAXI GEL POST OP (GAUZE/BANDAGES/DRESSINGS) ×1 IMPLANT
YANKAUER SUCT BULB TIP NO VENT (SUCTIONS) ×2 IMPLANT

## 2018-12-25 NOTE — ED Triage Notes (Signed)
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 He is in 10/10 pain bleeding controlled. Moved to green zone.

## 2018-12-25 NOTE — ED Provider Notes (Signed)
[facility] Provider Note   CSN: [id number] Arrival date & time: 12/25/18  1628     History   Chief Complaint Chief Complaint  Patient presents with  . NAIL IN LEFT KNEE    HPI Derrick Carlson is a 48 y.o. male presents for evaluation of left knee injury that occurred about 1 hour prior to ED arrival.  Patient reports he was at work and was using a nail gun when he accidentally got shot into his left knee.  Reports that he was initially able to ambulate and bear weight but now reports worsening pain.  He has not taken anything for pain.  He states he feels some sharp pinching pain in his foot.  No numbness.  He does not know when his last tetanus shot was.     The history is provided by the patient.    History reviewed. No pertinent past medical history.  There are no active problems to display for this patient.   History reviewed. No pertinent surgical history.      Home Medications    Prior to Admission medications   Medication Sig Start Date End Date Taking? Authorizing Provider  azithromycin (ZITHROMAX) 250 MG tablet Take 1 tablet (250 mg total) by mouth daily. Take first 2 tablets together, then 1 every day until finished. Patient not taking: Reported on 12/25/2018 01/17/18   Elvina SidleLauenstein, Kurt, MD  chlorpheniramine-HYDROcodone [facility](TUSSIONEX PENNKINETIC ER) 10-8 MG/5ML SUER Take 5 mLs by mouth every 12 (twelve) hours as needed for cough. Patient not taking: Reported on 12/25/2018 01/17/18   Elvina SidleLauenstein, Kurt, MD  pantoprazole (PROTONIX) 20 MG tablet Take 1 tablet (20 mg total) by mouth daily. Patient not taking: Reported on 12/25/2018 12/21/15   Dione BoozeGlick, David, MD    Family History No family history on file.  Social History Social History   Tobacco Use  . Smoking status: Never Smoker  . Smokeless tobacco: Never Used  Substance Use Topics  . Alcohol use: Yes    Comment: sometimes  . Drug use: No     Allergies   Patient  has no known allergies.   Review of Systems Review of Systems  Musculoskeletal:       Knee pain  Skin: Positive for wound.  All other systems reviewed and are negative.    Physical Exam Updated Vital Signs BP 123/74   Pulse 66   Temp 97.9 F (36.6 C) (Oral)   Resp (!) 22   Ht 5\' 5"  (1.651 m)   Wt 86.2 kg   SpO2 98%   BMI 31.62 kg/m   Physical Exam Vitals signs and nursing note reviewed.  Constitutional:      Appearance: He is well-developed.     Comments: Appears uncomfortable.  HENT:     Head: Normocephalic and atraumatic.  Eyes:     General: No scleral icterus.       Right eye: No discharge.        Left eye: No discharge.     Conjunctiva/sclera: Conjunctivae normal.  Cardiovascular:     Pulses:          Dorsalis pedis pulses are 2+ on the left side.  Pulmonary:     Effort: Pulmonary effort is normal.  Musculoskeletal:     Comments: Diffuse tenderness palpation on anterior aspect of left knee with obvious wound.  No visible head of nail.  Limited range of motion secondary to pain.  No bony tenderness in his tib-fib, ankle.  He can wiggle all 5 toes without any difficulty.  Skin:    General: Skin is warm and dry.     Capillary Refill: Capillary refill takes less than 2 seconds.     Comments: Good distal cap refill. LLE is not dusky in appearance or cool to touch.  Small 0.5 cm wound noted to the medial aspect of the left knee that is bleeding.   Neurological:     Mental Status: He is alert.     Comments: Sensation intact along major nerve distributions of BLE.   Psychiatric:        Speech: Speech normal.        Behavior: Behavior normal.      ED Treatments / Results  Labs (all labs ordered are listed, but only abnormal results are displayed) Labs Reviewed  BASIC METABOLIC PANEL - Abnormal; Notable for the following components:      Result Value   Glucose, Bld 131 (*)    All other components within normal limits  CBC WITH DIFFERENTIAL/PLATELET -  Abnormal; Notable for the following components:   Neutro Abs 7.8 (*)    All other components within normal limits  SARS CORONAVIRUS 2 BY RT PCR (HOSPITAL ORDER, PERFORMED IN [facility])    EKG EKG Interpretation  Date/Time:  Tuesday December 25 2018 18:00:27 EDT Ventricular Rate:  66 PR Interval:  148 QRS Duration: 88 QT Interval:  402 QTC Calculation: 421 R Axis:   33 Text Interpretation: Normal sinus rhythm Normal ECG When compared to prior, no significantg changes seen. No STEMI Confirmed by Theda Belfast ([id number]) on 12/25/2018 7:49:46 PM   Radiology Dg Chest 2 View  Result Date: 12/25/2018 CLINICAL DATA:  Preoperative assessment for knee surgery EXAM: CHEST - 2 VIEW COMPARISON:  September 30, 2004 FINDINGS: Lungs are clear. Heart is borderline enlarged with pulmonary vascularity normal. No adenopathy. No bone lesions. IMPRESSION: No edema or consolidation.  Heart borderline enlarged. Electronically Signed   By: Bretta Bang III M.D.   On: 12/25/2018 20:36   Dg Knee Left Port  Result Date: 12/25/2018 CLINICAL DATA:  Nail gun shot nail into knee EXAM: PORTABLE LEFT KNEE - 1-2 VIEW COMPARISON:  None. FINDINGS: There is a linear metallic foreign body extending obliquely through the medial femoral condyle. Additional smaller curvilinear foreign body is seen either within or along the anterior articular surface of the medial femoral condyle. Extensive soft tissue swelling. Small effusion. Mild tricompartmental degenerative changes. No other acute fracture or traumatic malalignment. IMPRESSION: 1. Linear metallic foreign body extending obliquely through the medial femoral condyle compatible with reported nail gun injury. 2. Additional smaller curvilinear foreign body is seen either within or along the anterior articular surface of the medial femoral condyle. Likely reflects the small metallic strut which stabilizes the nail within the cartridge. Electronically Signed   By:  Kreg Shropshire M.D.   On: 12/25/2018 17:03    Procedures Procedures (including critical care time)  Medications Ordered in ED Medications  chlorhexidine (HIBICLENS) 4 % liquid 4 application (has no administration in time range)  ceFAZolin (ANCEF) IVPB 2g/100 mL premix (has no administration in time range)  ceFAZolin (ANCEF) 2-4 GM/100ML-% IVPB (has no administration in time range)  HYDROmorphone (DILAUDID) injection 1 mg (has no administration in time range)  morphine 4 MG/ML injection 4 mg (4 mg Intravenous Given 12/25/18 1715)  ondansetron (ZOFRAN) injection 4 mg (4 mg Intravenous Given 12/25/18 1756)  Tdap (BOOSTRIX) injection 0.5 mL (0.5 mLs Intramuscular Given  12/25/18 1721)  ceFAZolin (ANCEF) IVPB 1 g/50 mL premix (0 g Intravenous Stopped 12/25/18 1822)  sodium chloride 0.9 % bolus 1,000 mL (1,000 mLs Intravenous New Bag/Given 12/25/18 1825)  HYDROmorphone (DILAUDID) injection 1 mg (1 mg Intravenous Given 12/25/18 1826)  bupivacaine-EPINEPHrine (MARCAINE W/ EPI) 0.5% -1:200000 (with pres) injection (has no administration in time range)     Initial Impression / Assessment and Plan / ED Course  I have reviewed the triage vital signs and the nursing notes.  Pertinent labs & imaging results that were available during my care of the patient were reviewed by me and considered in my medical decision making (see chart for details).        48 year old male who presents for evaluation of left knee pain status post an injury.  He reports that he was working on a [employer] and had a nail gun when it slipped, causing it to shoot a nail in his left knee.  Does not know when his last tetanus shot was.  No allergies noted medications.  He appears uncomfortable but no acute distress.  Patient is neurovascularly intact.  X-rays ordered.  X-ray reviewed.  There is a metallic foreign body noted to the medial condyle of the femur.  Will consult orthopedics.  Patient given Ancef for treatment of the  wound infections. Tdap updated.   Discussed patient with Dr. Veverly Fells (Ortho).  He will plan to consult patient.  Will patient will need to go to the OR.  Recommends obtaining EKG and chest x-ray.  CBC shows no leukocytosis.  Hemoglobin stable at 14.7.  BMP is unremarkable.  Discussed patient with Dr. Veverly Fells (Ortho).  After evaluation in the ED.  He will take patient to the OR.  Portions of this note were generated with Lobbyist. Dictation errors may occur despite best attempts at proofreading.  Final Clinical Impressions(s) / ED Diagnoses   Final diagnoses:  Foreign body of knee, left, initial encounter    ED Discharge Orders    None       Desma Mcgregor 12/25/18 2128    Tegeler, Gwenyth Allegra, MD 12/26/18 0110

## 2018-12-25 NOTE — Discharge Instructions (Signed)
Elevate and ice the knee when possible.  Take Antibiotic until the medicine is completed.  Take baby aspirin 81 mg chewable twice daily for 30 days to prevent blood clots  Follow up with Dr Veverly Fells in one week in the office, call [phone] for appointment

## 2018-12-25 NOTE — Anesthesia Procedure Notes (Signed)
Procedure Name: Intubation Date/Time: 12/25/2018 10:05 PM Performed by: Jearld Pies, CRNA Pre-anesthesia Checklist: Patient identified, Emergency Drugs available, Suction available and Patient being monitored Patient Re-evaluated:Patient Re-evaluated prior to induction Oxygen Delivery Method: Circle System Utilized Preoxygenation: Pre-oxygenation with 100% oxygen Induction Type: IV induction, Rapid sequence and Cricoid Pressure applied Laryngoscope Size: Mac and 3 Grade View: Grade I Tube type: Oral Tube size: 7.5 mm Number of attempts: 1 Airway Equipment and Method: Stylet Placement Confirmation: ETT inserted through vocal cords under direct vision,  positive ETCO2 and breath sounds checked- equal and bilateral Secured at: 22 cm Tube secured with: Tape Dental Injury: Teeth and Oropharynx as per pre-operative assessment

## 2018-12-25 NOTE — Transfer of Care (Signed)
Immediate Anesthesia Transfer of Care Note  Patient: Derrick Carlson  Procedure(s) Performed: Left Knee ARTHROSCOPY with Foreign Body Removal Distal Femur (Left Knee)  Patient Location: PACU  Anesthesia Type:General  Level of Consciousness: drowsy, patient cooperative and responds to stimulation  Airway & Oxygen Therapy: Patient Spontanous Breathing and Patient connected to nasal cannula oxygen  Post-op Assessment: Report given to RN and Post -op Vital signs reviewed and stable  Post vital signs: Reviewed and stable  Last Vitals:  Vitals Value Taken Time  BP 126/76 12/25/18 2334  Temp    Pulse 72 12/25/18 2335  Resp 23 12/25/18 2335  SpO2 98 % 12/25/18 2335  Vitals shown include unvalidated device data.  Last Pain:  Vitals:   12/25/18 1958  TempSrc:   PainSc: 5          Complications: No apparent anesthesia complications

## 2018-12-25 NOTE — Anesthesia Preprocedure Evaluation (Signed)
Anesthesia Evaluation  Patient identified by MRN, date of birth, ID band Patient awake    Reviewed: Allergy & Precautions, NPO status , Patient's Chart, lab work & pertinent test results  Airway Mallampati: II       Dental  (+) Teeth Intact, Dental Advisory Given   Pulmonary neg pulmonary ROS,    breath sounds clear to auscultation       Cardiovascular negative cardio ROS   Rhythm:Regular Rate:Normal     Neuro/Psych negative neurological ROS     GI/Hepatic Neg liver ROS,   Endo/Other    Renal/GU      Musculoskeletal   Abdominal Normal abdominal exam  (+)   Peds  Hematology negative hematology ROS (+)   Anesthesia Other Findings   Reproductive/Obstetrics                             Anesthesia Physical Anesthesia Plan  ASA: II  Anesthesia Plan: General   Post-op Pain Management:    Induction: Intravenous, Rapid sequence and Cricoid pressure planned  PONV Risk Score and Plan: 3 and Ondansetron, Dexamethasone and Midazolam  Airway Management Planned: Oral ETT  Additional Equipment: None  Intra-op Plan:   Post-operative Plan: Extubation in OR  Informed Consent: I have reviewed the patients History and Physical, chart, labs and discussed the procedure including the risks, benefits and alternatives for the proposed anesthesia with the patient or authorized representative who has indicated his/her understanding and acceptance.     Dental advisory given  Plan Discussed with: CRNA  Anesthesia Plan Comments:         Anesthesia Quick Evaluation

## 2018-12-25 NOTE — Brief Op Note (Signed)
12/25/2018  11:20 PM  PATIENT:  Derrick Carlson  48 y.o. male  PRE-OPERATIVE DIAGNOSIS:  Foreign Body Left knee, open knee joint due to nail gun injury  POST-OPERATIVE DIAGNOSIS:  same  PROCEDURE:  Left knee I+D arthroscopic and remove of loose body(nail) from distal femur  SURGEON:  Surgeon(s) and Role:    Netta Cedars, MD - Primary  PHYSICIAN ASSISTANT:   ASSISTANTS: Ventura Bruns, PA-C   ANESTHESIA:   general  EBL:  20 mL   BLOOD ADMINISTERED:none  DRAINS: hemovac intra-articular placement (not sewn in)  LOCAL MEDICATIONS USED:  NONE  SPECIMEN:  No Specimen  DISPOSITION OF SPECIMEN:  N/A  COUNTS:  YES  TOURNIQUET:  * No tourniquets in log *  DICTATION: .Other Dictation: Dictation Number [id number]  PLAN OF CARE: Admit to inpatient   PATIENT DISPOSITION:  PACU - hemodynamically stable.   Delay start of Pharmacological VTE agent (>24hrs) due to surgical blood loss or risk of bleeding: not applicable

## 2018-12-25 NOTE — H&P (Signed)
Derrick Carlson Derrick Carlson is an 48 y.o. male.   Chief Complaint: Nail in left knee HPI: 48 yo male who is s/p nail gun injury to the left knee. No other complaints. Patient reporting severe pain from the left knee. Otherwise he says he is healthy  History reviewed. No pertinent past medical history.  History reviewed. No pertinent surgical history.  No family history on file. Social History:  reports that he has never smoked. He has never used smokeless tobacco. He reports current alcohol use. He reports that he does not use drugs.  Allergies: No Known Allergies  (Not in a hospital admission)   Results for orders placed or performed during the hospital encounter of 12/25/18 (from the past 48 hour(s))  Basic metabolic panel     Status: Abnormal   Collection Time: 12/25/18  5:20 PM  Result Value Ref Range   Sodium 137 135 - 145 mmol/L   Potassium 3.8 3.5 - 5.1 mmol/L   Chloride 105 98 - 111 mmol/L   CO2 22 22 - 32 mmol/L   Glucose, Bld 131 (H) 70 - 99 mg/dL   BUN 16 6 - 20 mg/dL   Creatinine, Ser 7.67 0.61 - 1.24 mg/dL   Calcium 9.1 8.9 - 34.1 mg/dL   GFR calc non Af Amer >60 >60 mL/min   GFR calc Af Amer >60 >60 mL/min   Anion gap 10 5 - 15    Comment: Performed at [facility], [address] [place], [place] [address]  CBC with Differential     Status: Abnormal   Collection Time: 12/25/18  5:20 PM  Result Value Ref Range   WBC 9.8 4.0 - 10.5 K/uL   RBC 4.85 4.22 - 5.81 MIL/uL   Hemoglobin 14.7 13.0 - 17.0 g/dL   HCT 24.0 97.3 - 53.2 %   MCV 90.1 80.0 - 100.0 fL   MCH 30.3 26.0 - 34.0 pg   MCHC 33.6 30.0 - 36.0 g/dL   RDW 99.2 42.6 - 83.4 %   Platelets 233 150 - 400 K/uL   nRBC 0.0 0.0 - 0.2 %   Neutrophils Relative % 81 %   Neutro Abs 7.8 (H) 1.7 - 7.7 K/uL   Lymphocytes Relative 13 %   Lymphs Abs 1.3 0.7 - 4.0 K/uL   Monocytes Relative 5 %   Monocytes Absolute 0.5 0.1 - 1.0 K/uL   Eosinophils Relative 1 %   Eosinophils Absolute 0.1 0.0 - 0.5 K/uL   Basophils  Relative 0 %   Basophils Absolute 0.0 0.0 - 0.1 K/uL   Immature Granulocytes 0 %   Abs Immature Granulocytes 0.04 0.00 - 0.07 K/uL    Comment: Performed at [facility], [address], [place], [place] [address]   Dg Knee Left Port  Result Date: 12/25/2018 CLINICAL DATA:  Nail gun shot nail into knee EXAM: PORTABLE LEFT KNEE - 1-2 VIEW COMPARISON:  None. FINDINGS: There is a linear metallic foreign body extending obliquely through the medial femoral condyle. Additional smaller curvilinear foreign body is seen either within or along the anterior articular surface of the medial femoral condyle. Extensive soft tissue swelling. Small effusion. Mild tricompartmental degenerative changes. No other acute fracture or traumatic malalignment. IMPRESSION: 1. Linear metallic foreign body extending obliquely through the medial femoral condyle compatible with reported nail gun injury. 2. Additional smaller curvilinear foreign body is seen either within or along the anterior articular surface of the medial femoral condyle. Likely reflects the small metallic  strut which stabilizes the nail within the cartridge. Electronically Signed   By: Lovena Le M.D.   On: 12/25/2018 17:03    ROS denies SOB or CP  Blood pressure 120/80, pulse 63, temperature 97.9 F (36.6 C), temperature source Oral, resp. rate (!) 22, height 5\' 5"  (1.651 m), weight 86.2 kg, SpO2 97 %. Physical Exam  HEENT: normal Neck non tender Full AROM neck and shoulders and UEs Chest non tender with normal excursion Heart regular Right LE with pain free AROM, no deformity Left knee with small open wound draining medial side No other deformity or swelling Distally NVI  Assessment/Plan Nail gun injury with retained nail in the left distal femur Will need to go to the OR for I+D and FB removal and knee arthroscopy to wash out the joint Abx given and Tetanus updated OR notifiied NPO Patient and family agree with the plan.  Augustin Schooling, MD 12/25/2018, 6:22 PM

## 2018-12-26 ENCOUNTER — Other Ambulatory Visit: Payer: Self-pay

## 2018-12-26 ENCOUNTER — Encounter (HOSPITAL_COMMUNITY): Payer: Self-pay | Admitting: General Practice

## 2018-12-26 MED ORDER — OXYCODONE HCL 5 MG PO TABS
5.0000 mg | ORAL_TABLET | ORAL | Status: DC | PRN
  Administered 2018-12-26: 16:00:00 10 mg via ORAL
  Filled 2018-12-26: qty 2

## 2018-12-26 MED ORDER — HYDROCOD POLST-CPM POLST ER 10-8 MG/5ML PO SUER: 5.0000 mL | Freq: Two times a day (BID) | ORAL | Status: DC | PRN

## 2018-12-26 MED ORDER — CEFAZOLIN SODIUM-DEXTROSE 2-4 GM/100ML-% IV SOLN
2.0000 g | Freq: Four times a day (QID) | INTRAVENOUS | Status: AC
  Administered 2018-12-26 (×3): 2 g via INTRAVENOUS
  Filled 2018-12-26 (×3): qty 100

## 2018-12-26 MED ORDER — DOCUSATE SODIUM 100 MG PO CAPS
100.0000 mg | ORAL_CAPSULE | Freq: Two times a day (BID) | ORAL | Status: DC
  Administered 2018-12-26: 100 mg via ORAL
  Filled 2018-12-26: qty 1

## 2018-12-26 MED ORDER — ACETAMINOPHEN 325 MG PO TABS: 325.0000 mg | ORAL_TABLET | Freq: Four times a day (QID) | ORAL | Status: DC | PRN

## 2018-12-26 MED ORDER — ONDANSETRON HCL 4 MG/2ML IJ SOLN: 4.0000 mg | Freq: Four times a day (QID) | INTRAMUSCULAR | Status: DC | PRN

## 2018-12-26 MED ORDER — ONDANSETRON HCL 4 MG PO TABS: 4.0000 mg | ORAL_TABLET | Freq: Four times a day (QID) | ORAL | Status: DC | PRN

## 2018-12-26 MED ORDER — HYDROMORPHONE HCL 1 MG/ML IJ SOLN
0.5000 mg | INTRAMUSCULAR | Status: DC | PRN
  Administered 2018-12-26 (×2): 1 mg via INTRAVENOUS
  Filled 2018-12-26 (×2): qty 1

## 2018-12-26 MED ORDER — METOCLOPRAMIDE HCL 5 MG PO TABS: 5.0000 mg | ORAL_TABLET | Freq: Three times a day (TID) | ORAL | Status: DC | PRN

## 2018-12-26 MED ORDER — PANTOPRAZOLE SODIUM 20 MG PO TBEC
20.0000 mg | DELAYED_RELEASE_TABLET | Freq: Every day | ORAL | Status: DC
  Administered 2018-12-26: 09:00:00 20 mg via ORAL
  Filled 2018-12-26: qty 1

## 2018-12-26 MED ORDER — METOCLOPRAMIDE HCL 5 MG/ML IJ SOLN: 5.0000 mg | Freq: Three times a day (TID) | INTRAMUSCULAR | Status: DC | PRN

## 2018-12-26 NOTE — Progress Notes (Signed)
Discharge paperwork and instructions given to pt. Per pt, provider d/c hemovac this morning. Pt not in distress and tolerated well.

## 2018-12-26 NOTE — Discharge Summary (Signed)
Orthopedic Discharge Summary        Physician Discharge Summary  Patient ID: Derrick Carlson MRN: [id number] DOB/AGE: [birth year] 48 y.o.  Admit date: 12/25/2018 Discharge date: 12/26/2018   Procedures:  Procedure(s) (LRB): Left knee I+D arthroscopic and removal of loose body (nail) from distal femur (Left)  Attending Physician:  Dr. Esmond Plants  Admission Diagnoses:   12/25/18  Discharge Diagnoses:  12/26/18   History reviewed. No pertinent past medical history.  PCP: Patient, No Pcp Per   Discharged Condition: good  Hospital Course:  Patient underwent the above stated procedure on 12/25/2018. Patient tolerated the procedure well and brought to the recovery room in good condition and subsequently to the floor. Patient had an uncomplicated hospital course and was stable for discharge.   Disposition: Discharge disposition: 01-Home or Self Care      with follow up in 2 weeks   Follow-up Information    Netta Cedars, MD. Call in 2 weeks.   Specialty: Orthopedic Surgery Why: [phone] Contact information: [address] [place] [address] [phone]           Discharge Instructions    Call MD / Call 911   Complete by: As directed    If you experience chest pain or shortness of breath, CALL 911 and be transported to the hospital emergency room.  If you develope a fever above 101 F, pus (white drainage) or increased drainage or redness at the wound, or calf pain, call your surgeon's office.   Constipation Prevention   Complete by: As directed    Drink plenty of fluids.  Prune juice may be helpful.  You may use a stool softener, such as Colace (over the counter) 100 mg twice a day.  Use MiraLax (over the counter) for constipation as needed.   Diet - low sodium heart healthy   Complete by: As directed    Increase activity slowly as tolerated   Complete by: As directed       Allergies as of 12/26/2018   No Known Allergies      Medication List    STOP taking these medications   azithromycin 250 MG tablet Commonly known as: ZITHROMAX   chlorpheniramine-HYDROcodone 10-8 MG/5ML Suer Commonly known as: Tussionex Pennkinetic ER   pantoprazole 20 MG tablet Commonly known as: PROTONIX     TAKE these medications   aspirin 81 MG chewable tablet Commonly known as: Aspirin Childrens Chew 1 tablet (81 mg total) by mouth 2 (two) times daily.   methocarbamol 500 MG tablet Commonly known as: Robaxin Take 1 tablet (500 mg total) by mouth every 6 (six) hours as needed.   oxyCODONE-acetaminophen 5-325 MG tablet Commonly known as: Percocet Take 1-2 tablets by mouth every 4 (four) hours as needed for severe pain.         Signed: Ventura Bruns 12/26/2018, 8:11 AM  [facility] is now [employer] [address], [place], [place], [place] [address] Phone: [place]

## 2018-12-26 NOTE — Anesthesia Postprocedure Evaluation (Signed)
Anesthesia Post Note  Patient: Jessen Siegman  Procedure(s) Performed: Left knee I+D arthroscopic and removal of loose body (nail) from distal femur (Left Knee)     Patient location during evaluation: PACU Anesthesia Type: General Level of consciousness: awake and alert Pain management: pain level controlled Vital Signs Assessment: post-procedure vital signs reviewed and stable Respiratory status: spontaneous breathing, nonlabored ventilation, respiratory function stable and patient connected to nasal cannula oxygen Cardiovascular status: blood pressure returned to baseline and stable Postop Assessment: no apparent nausea or vomiting Anesthetic complications: no    Last Vitals:  Vitals:   12/26/18 0111 12/26/18 0338  BP: 126/80 119/75  Pulse: (!) 59 (!) 128  Resp: 14 13  Temp: 36.8 C 36.6 C  SpO2: (!) 88% (!) 81%    Last Pain:  Vitals:   12/26/18 0338  TempSrc: Oral  PainSc:                  Effie Berkshire

## 2018-12-26 NOTE — Progress Notes (Signed)
Physical Therapy Treatment Patient Details Name: Derrick Carlson MRN: [id number] DOB: 03/05/1970 Today's Date: 12/26/2018    History of Present Illness Pt is a 48 y/o male admitted after sustaining an injury to his L knee from a nail gun. Pt is now s/p Foreign body removal, left distal femur, with I and D of left knee joint and arthroscopic I and D/lavage of the knee joint for open joint. No pertinent PMH.    PT Comments    Pt making steady progress with mobility. He tolerated stair training this session without difficulties. Pt's spouse present throughout as well. Plan is for pt to d/c home today with family. Pt is ready to d/c from PT perspective.    Follow Up Recommendations  No PT follow up;Supervision for mobility/OOB     Equipment Recommendations  Rolling walker with 5" wheels    Recommendations for Other Services       Precautions / Restrictions Precautions Precautions: Fall Restrictions Weight Bearing Restrictions: Yes LLE Weight Bearing: Weight bearing as tolerated    Mobility  Bed Mobility               General bed mobility comments: pt OOB in recliner chair upon arrival  Transfers Overall transfer level: Needs assistance Equipment used: Rolling walker (2 wheeled) Transfers: Sit to/from Stand Sit to Stand: Supervision         General transfer comment: supervision for safety, good technique utilized  Ambulation/Gait Ambulation/Gait assistance: Supervision Gait Distance (Feet): 100 Feet Assistive device: Rolling walker (2 wheeled) Gait Pattern/deviations: Step-to pattern;Decreased step length - right;Decreased step length - left;Decreased stance time - left;Decreased stride length;Decreased weight shift to left;Antalgic Gait velocity: decreased   General Gait Details: pt generally steady overall with RW, no LOB or need for physical assistance   Stairs Stairs: Yes Stairs assistance: Min guard Stair Management: One rail Right;Step to  pattern;Forwards Number of Stairs: 3 General stair comments: cueing for sequencing, min guard for safety; pt's spouse present throughout   Wheelchair Mobility    Modified Rankin (Stroke Patients Only)       Balance Overall balance assessment: Needs assistance Sitting-balance support: Feet supported Sitting balance-Leahy Scale: Good     Standing balance support: During functional activity;Bilateral upper extremity supported;Single extremity supported Standing balance-Leahy Scale: Poor                              Cognition Arousal/Alertness: Awake/alert Behavior During Therapy: WFL for tasks assessed/performed Overall Cognitive Status: Within Functional Limits for tasks assessed                                        Exercises      General Comments        Pertinent Vitals/Pain Pain Assessment: Faces Faces Pain Scale: Hurts a little bit Pain Location: L knee with attempted flexion Pain Descriptors / Indicators: Guarding Pain Intervention(s): Monitored during session    Home Living                      Prior Function            PT Goals (current goals can now be found in the care plan section) Acute Rehab PT Goals PT Goal Formulation: With patient Time For Goal Achievement: 01/09/19 Potential to Achieve Goals: Good Progress towards PT goals: Progressing toward  goals    Frequency    Min 5X/week      PT Plan Current plan remains appropriate    Co-evaluation              AM-PAC PT "6 Clicks" Mobility   Outcome Measure  Help needed turning from your back to your side while in a flat bed without using bedrails?: None Help needed moving from lying on your back to sitting on the side of a flat bed without using bedrails?: A Little Help needed moving to and from a bed to a chair (including a wheelchair)?: A Little Help needed standing up from a chair using your arms (e.g., wheelchair or bedside chair)?:  None Help needed to walk in hospital room?: None Help needed climbing 3-5 steps with a railing? : A Little 6 Click Score: 21    End of Session Equipment Utilized During Treatment: Gait belt Activity Tolerance: Patient tolerated treatment well Patient left: in chair;with call bell/phone within reach;with family/visitor present Nurse Communication: Mobility status PT Visit Diagnosis: Other abnormalities of gait and mobility (R26.89);Pain Pain - Right/Left: Left Pain - part of body: Knee     Time: 4580-9983 PT Time Calculation (min) (ACUTE ONLY): 17 min  Charges:  $Gait Training: 8-22 mins                     Deborah Chalk, [place], DPT  Acute Rehabilitation Services Pager [phone] Office [phone]     Derrick Carlson 12/26/2018, 2:54 PM

## 2018-12-26 NOTE — Progress Notes (Signed)
   Subjective: 1 Day Post-Op Procedure(s) (LRB): Left knee I+D arthroscopic and removal of loose body (nail) from distal femur (Left)  Pt c/o moderate pain to the left knee this morning Denies any numbness or tingling distally Otherwise doing well Patient reports pain as moderate.  Objective:   VITALS:   Vitals:   12/26/18 0111 12/26/18 0338  BP: 126/80 119/75  Pulse: (!) 59 (!) 128  Resp: 14 13  Temp: 98.2 F (36.8 C) 97.8 F (36.6 C)  SpO2: (!) 88% (!) 81%    Left knee dressing intact Drain removed this morning No signs of drainage  nv intact distally  LABS Recent Labs    12/25/18 1720  HGB 14.7  HCT 43.7  WBC 9.8  PLT 233    Recent Labs    12/25/18 1720  NA 137  K 3.8  BUN 16  CREATININE 1.13  GLUCOSE 131*     Assessment/Plan: 1 Day Post-Op Procedure(s) (LRB): Left knee I+D arthroscopic and removal of loose body (nail) from distal femur (Left) Plan to d/c home this afternoon after 1 more IV antibiotic dose F/u in the office in 1 weeks Keep dressing on left leg Weight bearing as tolerated Pain management     Merla Riches PA-C, [place] is now [employer] [facility] [address], [address], [place], [place] [address] Phone: [phone] [email or web address] Facebook  [employer]

## 2018-12-26 NOTE — Progress Notes (Signed)
Gave wife the ring. Wife brought phone Charity fundraiser.  room number given

## 2018-12-26 NOTE — Progress Notes (Signed)
CSW visit with the patient at bedside to discuss PT equipment recommendation for rolling walker. Patient informed CSW he will purchase a rolling walker from [place], [place] did not need to arrange equipment.   Thurmond Butts, MSW, [place] Social Worker [phone]

## 2018-12-26 NOTE — Evaluation (Signed)
Physical Therapy Evaluation Patient Details Name: Derrick Carlson MRN: [id number] DOB: Aug 05, 1970 Today's Date: 12/26/2018   History of Present Illness  Pt is a 48 y/o male admitted after sustaining an injury to his L knee from a nail gun. Pt is now s/p Foreign body removal, left distal femur, with I and D of left knee joint and arthroscopic I and D/lavage of the knee joint for open joint. No pertinent PMH.    Clinical Impression  Pt presented supine in bed with HOB elevated, awake and willing to participate in therapy session. Prior to admission, pt reported that he was independent with all functional mobility. Pt lives with his wife and son in a single level home with four steps to enter. At the time of evaluation, pt required min A for bed mobility, min guard for transfers and min guard to ambulate a short distance within his room. Pt limited secondary to increasing pain with mobility. Plan for stair training at next session prior to d/c home today with family.    Follow Up Recommendations No PT follow up;Supervision for mobility/OOB    Equipment Recommendations  Rolling walker with 5" wheels    Recommendations for Other Services       Precautions / Restrictions Precautions Precautions: Fall Restrictions Weight Bearing Restrictions: Yes LLE Weight Bearing: Weight bearing as tolerated      Mobility  Bed Mobility Overal bed mobility: Needs Assistance Bed Mobility: Supine to Sit     Supine to sit: Min assist     General bed mobility comments: min A for movement of L LE off of bed  Transfers Overall transfer level: Needs assistance Equipment used: Rolling walker (2 wheeled) Transfers: Sit to/from Stand Sit to Stand: Min guard         General transfer comment: cueing for technique and safe hand placement, min guard for safety with transitional movement into standing from EOB  Ambulation/Gait Ambulation/Gait assistance: Min guard Gait Distance (Feet): 30  Feet(limited due to pain) Assistive device: Rolling walker (2 wheeled) Gait Pattern/deviations: Step-to pattern;Decreased step length - right;Decreased step length - left;Decreased stance time - left;Decreased stride length;Decreased weight shift to left;Antalgic Gait velocity: decreased   General Gait Details: pt with slow, antalgic gait pattern; able to accept a good amount of weight through L LE; however, with decreased knee flexion during swing phase of L LE  Stairs            Wheelchair Mobility    Modified Rankin (Stroke Patients Only)       Balance Overall balance assessment: Needs assistance Sitting-balance support: Feet supported Sitting balance-Leahy Scale: Good     Standing balance support: During functional activity;Bilateral upper extremity supported;Single extremity supported Standing balance-Leahy Scale: Poor                               Pertinent Vitals/Pain Pain Assessment: Faces Faces Pain Scale: Hurts even more Pain Location: L knee with attempted flexion Pain Descriptors / Indicators: Guarding;Grimacing Pain Intervention(s): Monitored during session;Repositioned    Home Living Family/patient expects to be discharged to:: Private residence Living Arrangements: Spouse/significant other;Children Available Help at Discharge: Family;Available 24 hours/day Type of Home: House Home Access: Stairs to enter Entrance Stairs-Rails: Psychiatric nurse of Steps: 4 Home Layout: One level Home Equipment: None      Prior Function Level of Independence: Independent               Hand  Dominance        Extremity/Trunk Assessment   Upper Extremity Assessment Upper Extremity Assessment: Overall WFL for tasks assessed    Lower Extremity Assessment Lower Extremity Assessment: LLE deficits/detail LLE Deficits / Details: pt with decreased strength and ROM limitations secondary to post-op pain and weakness; pt only able to  flex knee to ~45 degrees with increasing pain LLE: Unable to fully assess due to pain    Cervical / Trunk Assessment Cervical / Trunk Assessment: Normal  Communication   Communication: No difficulties  Cognition Arousal/Alertness: Awake/alert Behavior During Therapy: WFL for tasks assessed/performed Overall Cognitive Status: Within Functional Limits for tasks assessed                                        General Comments      Exercises     Assessment/Plan    PT Assessment Patient needs continued PT services  PT Problem List Decreased strength;Decreased range of motion;Decreased activity tolerance;Decreased balance;Decreased mobility;Decreased coordination;Decreased safety awareness;Decreased knowledge of use of DME;Decreased knowledge of precautions;Pain       PT Treatment Interventions DME instruction;Gait training;Stair training;Functional mobility training;Therapeutic activities;Therapeutic exercise;Balance training;Neuromuscular re-education;Patient/family education    PT Goals (Current goals can be found in the Care Plan section)  Acute Rehab PT Goals Patient Stated Goal: decrease pain; home today PT Goal Formulation: With patient Time For Goal Achievement: 01/09/19 Potential to Achieve Goals: Good    Frequency Min 5X/week   Barriers to discharge        Co-evaluation               AM-PAC PT "6 Clicks" Mobility  Outcome Measure Help needed turning from your back to your side while in a flat bed without using bedrails?: None Help needed moving from lying on your back to sitting on the side of a flat bed without using bedrails?: A Little Help needed moving to and from a bed to a chair (including a wheelchair)?: A Little Help needed standing up from a chair using your arms (e.g., wheelchair or bedside chair)?: None Help needed to walk in hospital room?: A Little Help needed climbing 3-5 steps with a railing? : A Little 6 Click Score: 20     End of Session Equipment Utilized During Treatment: Gait belt Activity Tolerance: Patient limited by pain Patient left: in chair;with call bell/phone within reach Nurse Communication: Mobility status PT Visit Diagnosis: Other abnormalities of gait and mobility (R26.89);Pain Pain - Right/Left: Left Pain - part of body: Knee    Time: 1194-1740 PT Time Calculation (min) (ACUTE ONLY): 24 min   Charges:   PT Evaluation $PT Eval Moderate Complexity: 1 Mod PT Treatments $Gait Training: 8-22 mins        Deborah Chalk, PT, DPT  Acute Rehabilitation Services Pager [phone] Office [phone]    Alessandra Bevels Reinhardt Licausi 12/26/2018, 9:54 AM

## 2018-12-26 NOTE — Op Note (Signed)
NAME: Derrick Carlson, Derrick Carlson MEDICAL RECORD WE:[id number] ACCOUNT [id number] DATE OF BIRTH:06/02/1970 FACILITY: [facility] LOCATION: [facility] PHYSICIAN:STEVEN R. Jude Linck, MD  OPERATIVE REPORT  DATE OF PROCEDURE:  12/25/2018  PREOPERATIVE DIAGNOSIS:  Nail gun injury to left knee/distal femur with open knee joint.  POSTOPERATIVE DIAGNOSIS:  Nail gun injury to left knee/distal femur with open knee joint.  PROCEDURE PERFORMED:  Foreign body removal, left distal femur, with I and D of left knee joint and arthroscopic I and D/lavage of the knee joint for open joint.  ATTENDING SURGEON:  Esmond Plants, MD  ASSISTANT:  Darol Destine, [place], who was scrubbed during the entire procedure and necessary for satisfactory completion of surgery and positioning and assistance with localizing foreign body with C-arm.  ANESTHESIA:  General anesthesia was used.    ESTIMATED BLOOD LOSS:  Minimal.  FLUID REPLACEMENT:  1500 mL crystalloid.  INSTRUMENT COUNTS:  Correct.  COMPLICATIONS:  No complications.  ANTIBIOTICS:  Perioperative antibiotics were given.  INDICATIONS:  The patient is a 48 year old male who presents with a history of a nail gun injury to his left knee.  The entry point was thought to be intraarticular.  There was joint fluid coming out of the wound.  The nail appeared to be embedded into  the distal femur, potentially with the nail head underneath the surface of the bone.  We discussed options with the patient, recommending surgical irrigation and debridement and removal of the foreign body.  The patient agreed to this.  We felt like due  to the length of time from the injury to actually getting him to have surgery due to multiple emergency cases that went in front of [place] that we would need to do an arthroscopic washout of his joint as well as his joint has been open for hours, potentially  8 hours.  Informed consent was obtained.  DESCRIPTION OF PROCEDURE:  After an adequate level of anesthesia  was achieved, the patient was positioned supine on the operating room table.  Left leg correctly identified and sterilely prepped and draped in the usual manner.  Timeout called, verifying  correct patient, correct site.  The entry point was proximal to the joint line over the medial femoral condyle, angling distal and a little posterior.  We made a skin incision longitudinally, incorporating that nail site down through the subcutaneous  tissues.  There was quite a bit of cloudy-looking fluid coming out of the knee joint indicating potentially some early contamination infection.  We went down to the joint, opened up the small arthrotomy, and we were able to visualize the nail that was  just outside the bone.  We used a rongeur to remove the nail.  There was a small piece of metal from this nail gun type nail that was still inside that bone tunnel.  We unsuccessfully tried to remove that over the course of 30 minutes using arthroscopic  graspers, suction irrigation, curettes, really all manner of equipment and C-arm trying to remove that small piece of metal, but it was felt to be embedded in cancellous bone and not retrievable.  We irrigated thoroughly with 12 L of normal saline  irrigation through the knee joint arthroscopically, which we instituted arthroscopic portal superolateral outflow and an anterolateral scope.  Then we utilized the anteromedial arthrotomy to additionally use the shaver to clean out that gutter and to  make sure there was no other metal debris.  There were a couple of little areas of some small metallic debris in the  synovium, which were removed with suction shaver.  We did visualize some degenerative fraying of the meniscus, but no major meniscal  tears in the medial compartment.  The ACL was intact, and the articular cartilage looked to be in good shape with minimal chondromalacia in the patellofemoral joint.  After completion of the 12 L of normal saline irrigation through the  scope, we went  ahead and placed a drain through the superolateral scope portal and did not sew that in, but we did close the portal with a nylon suture.  We had a medium Hemovac drain coming out from that that would be intraarticular.  We then used 0 PDS to sew up the  arthrotomy and then 2-0 nylon for a vertical mattress suture to close the skin for the medial wound.  We also used the nylon to close the anterolateral portal as well.  A sterile compressive bandage and knee immobilizer were applied.  The patient was  transported to recovery room in stable condition.  LN/NUANCE  D:12/25/2018 T:12/26/2018 JOB:[id number]

## 2018-12-26 NOTE — Plan of Care (Signed)
  Problem: Activity: Goal: Ability to avoid complications of mobility impairment will improve Outcome: Progressing Goal: Ability to tolerate increased activity will improve Outcome: Progressing   

## 2020-05-05 IMAGING — DX DG KNEE 1-2V PORT*L*
4 series · 4 of 4 positions shown · non-contrast
Comparison: None.

CLINICAL DATA: Nail gun shot nail into knee

EXAM:
PORTABLE LEFT KNEE - 1-2 VIEW

[knee ap]
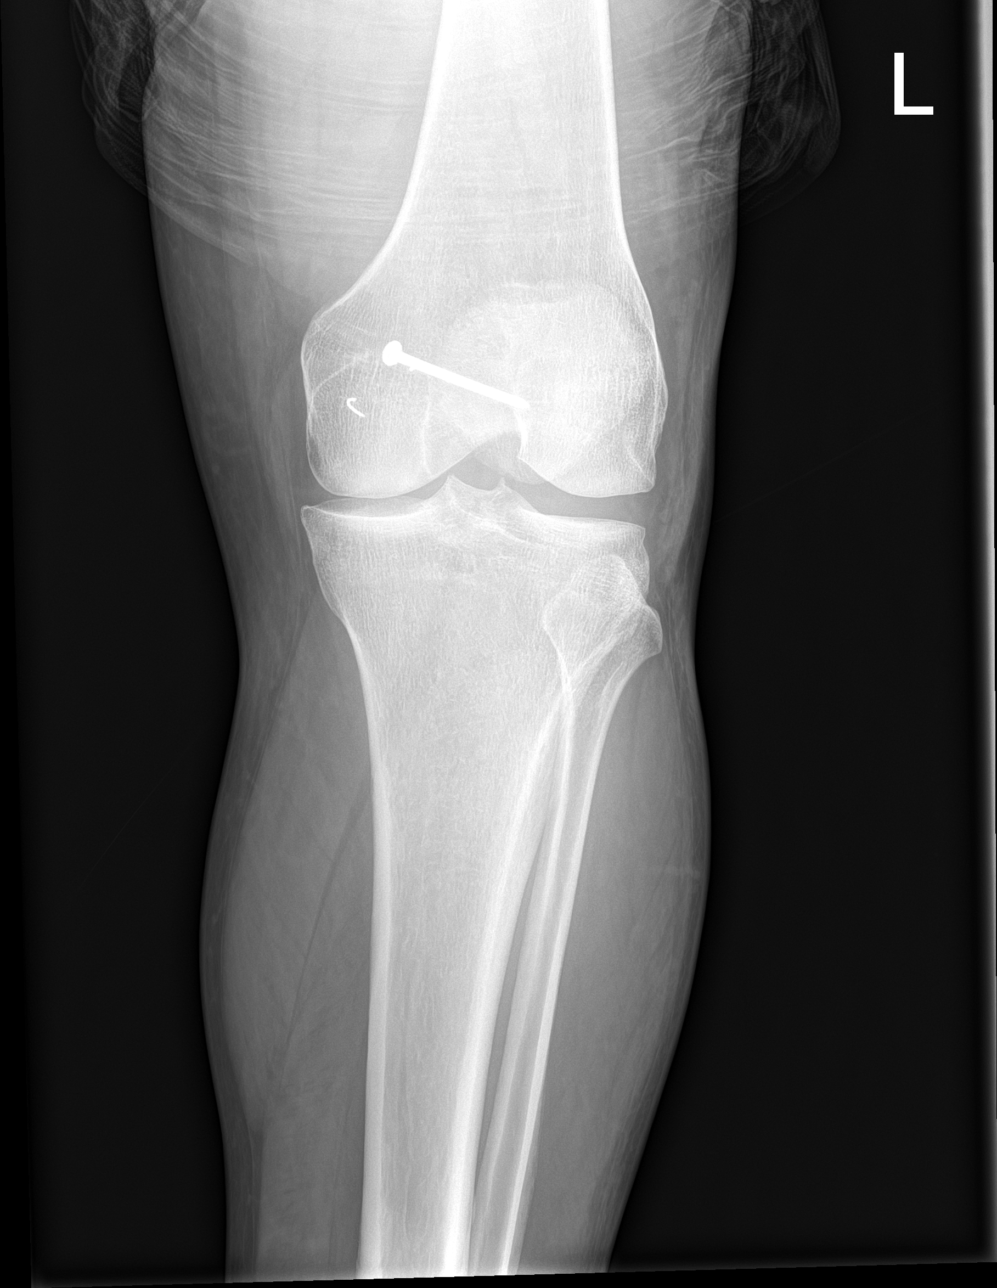

[knee lat]
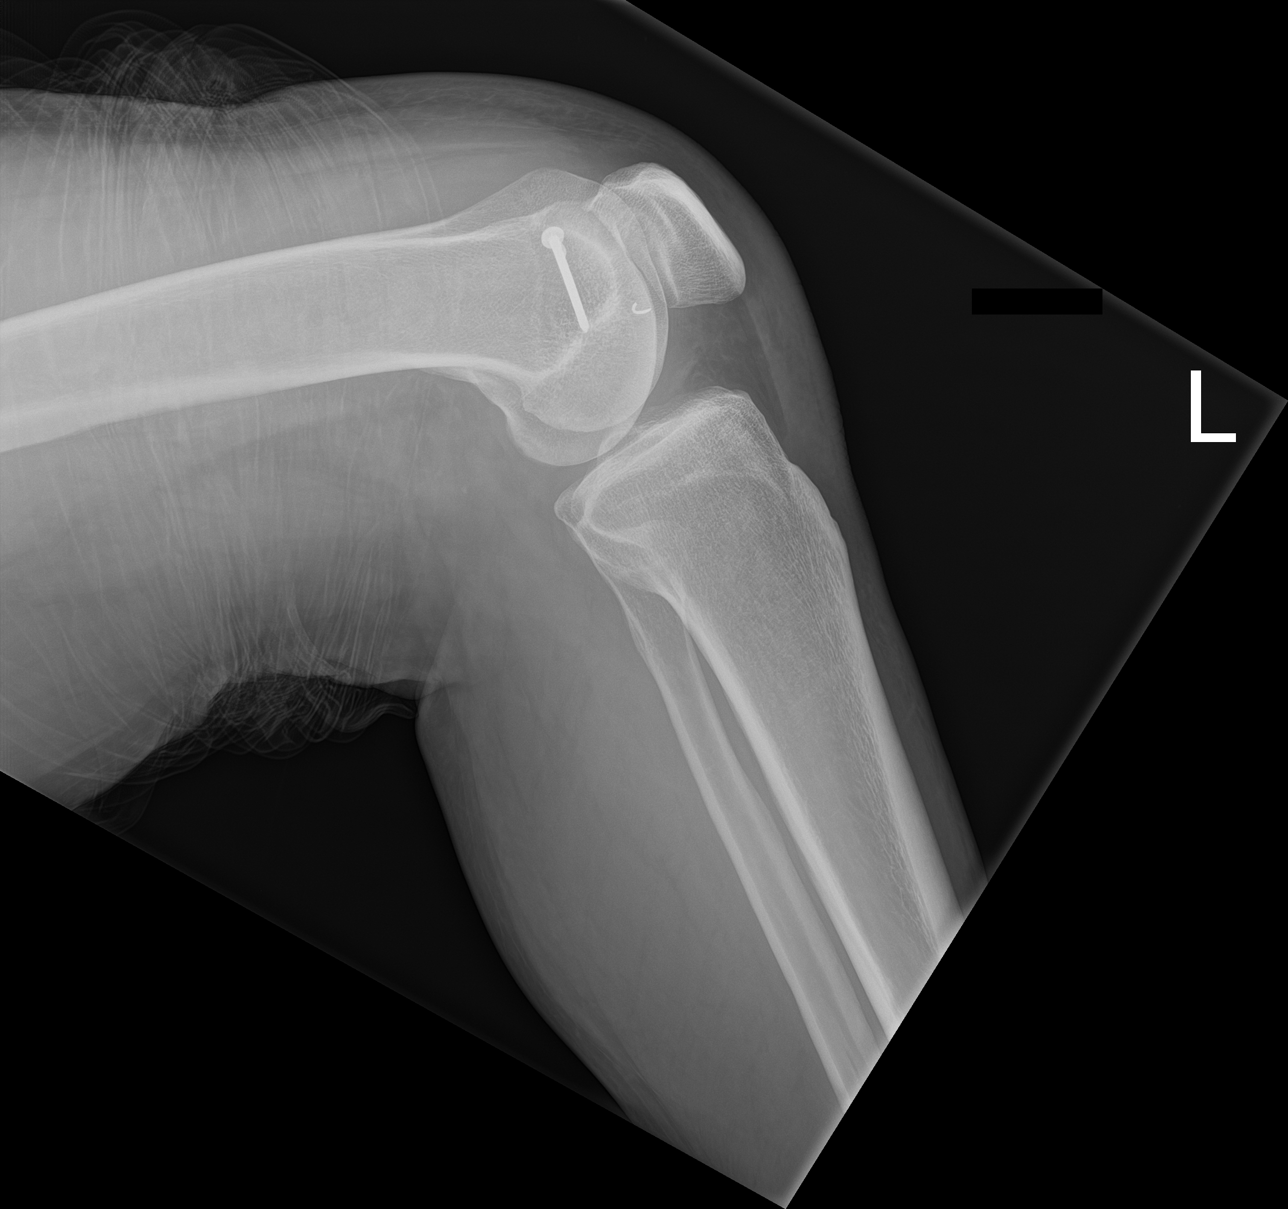

[knee obl (1 of 2)]
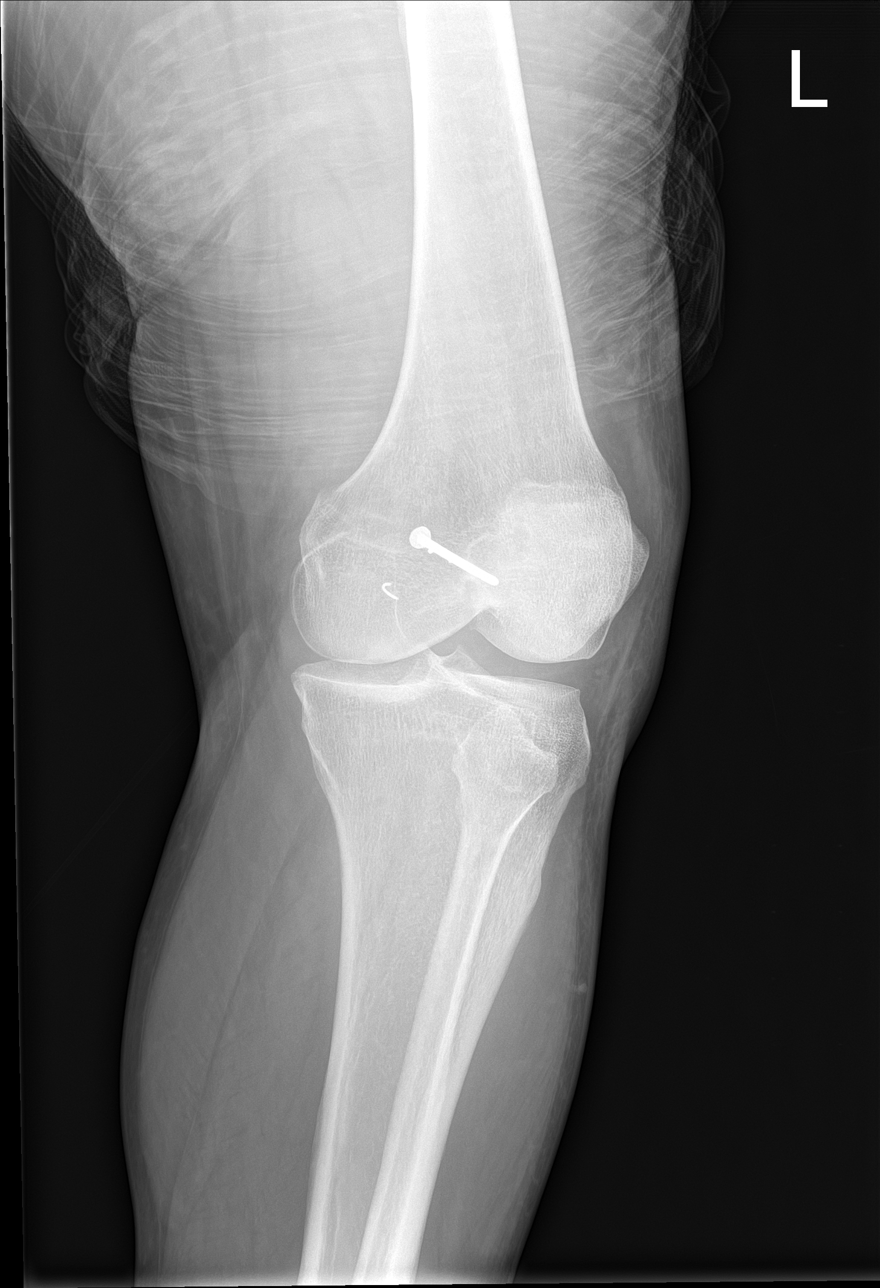

[knee obl (2 of 2)]
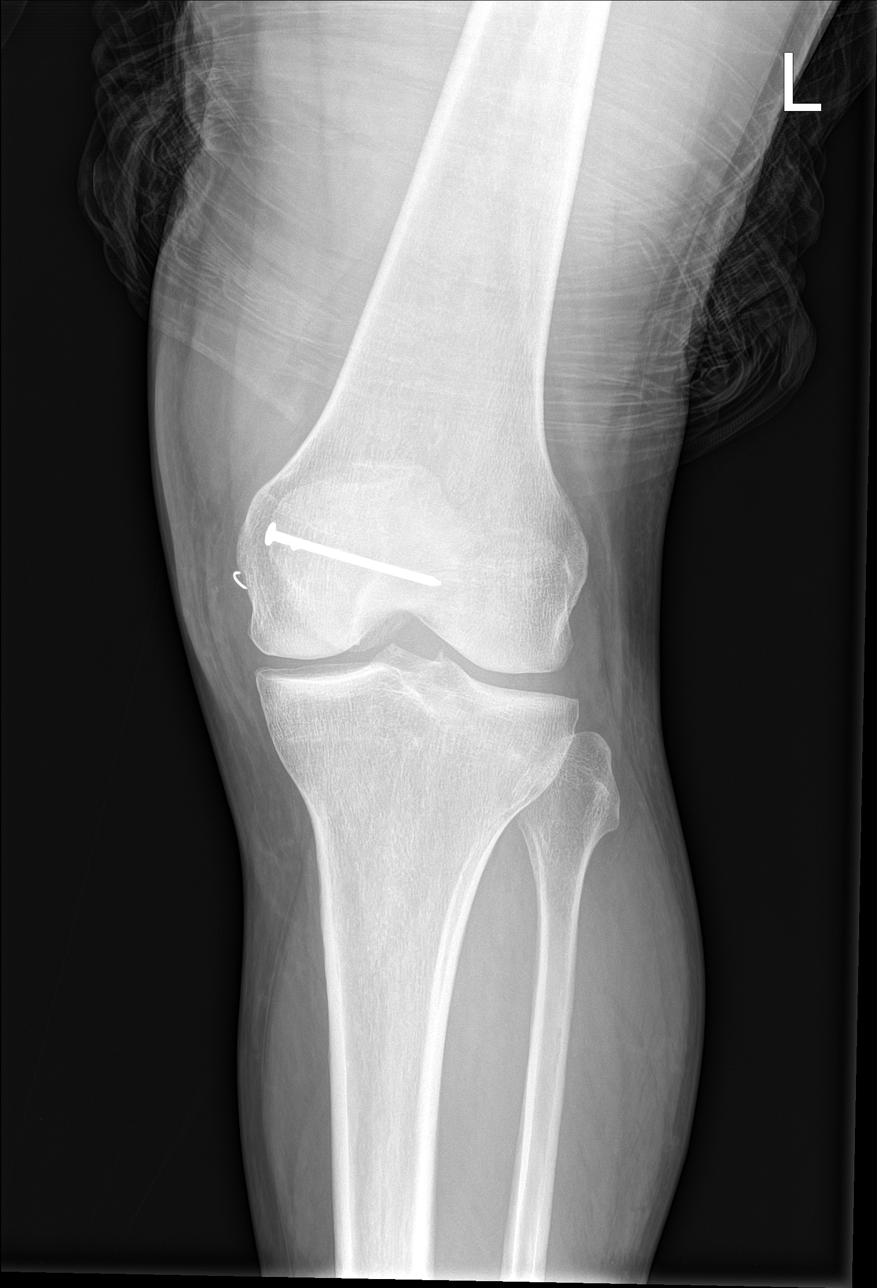

[4 of 4 positions shown; findings below may reference images not displayed]

FINDINGS: There is a linear metallic foreign body extending obliquely through
the medial femoral condyle. Additional smaller curvilinear foreign
body is seen either within or along the anterior articular surface
of the medial femoral condyle. Extensive soft tissue swelling. Small
effusion. Mild tricompartmental degenerative changes. No other acute
fracture or traumatic malalignment.
IMPRESSION: 1. Linear metallic foreign body extending obliquely through the
medial femoral condyle compatible with reported nail gun injury.
2. Additional smaller curvilinear foreign body is seen either within
or along the anterior articular surface of the medial femoral
condyle. Likely reflects the small metallic strut which stabilizes
the nail within the cartridge.

## 2021-04-08 ENCOUNTER — Telehealth: Payer: Self-pay | Admitting: Physician Assistant

## 2021-04-08 DIAGNOSIS — U071 COVID-19: Secondary | ICD-10-CM

## 2021-04-08 MED ORDER — NIRMATRELVIR/RITONAVIR (PAXLOVID)TABLET: 3.0000 | ORAL_TABLET | Freq: Two times a day (BID) | ORAL | 0 refills | Status: AC

## 2021-04-08 NOTE — Progress Notes (Signed)
Virtual Visit Consent   Derrick Carlson, you are scheduled for a virtual visit with a [facility] provider today.     Just as with appointments in the office, your consent must be obtained to participate.  Your consent will be active for this visit and any virtual visit you may have with one of our providers in the next 365 days.     If you have a MyChart account, a copy of this consent can be sent to you electronically.  All virtual visits are billed to your insurance company just like a traditional visit in the office.    As this is a virtual visit, video technology does not allow for your provider to perform a traditional examination.  This may limit your provider's ability to fully assess your condition.  If your provider identifies any concerns that need to be evaluated in person or the need to arrange testing (such as labs, EKG, etc.), we will make arrangements to do so.     Although advances in technology are sophisticated, we cannot ensure that it will always work on either your end or our end.  If the connection with a video visit is poor, the visit may have to be switched to a telephone visit.  With either a video or telephone visit, we are not always able to ensure that we have a secure connection.     I need to obtain your verbal consent now.   Are you willing to proceed with your visit today?    Derrick Carlson has provided verbal consent on 04/08/2021 for a virtual visit (video or telephone).   Margaretann Loveless, PA-C   Date: 04/08/2021 2:44 PM   Virtual Visit via Video Note   I, Margaretann Loveless, connected with  Derrick Carlson  ([id number], 11/27/1970) on 04/08/21 at  2:45 PM EST by a video-enabled telemedicine application and verified that I am speaking with the correct person using two identifiers.  Location: Patient: Virtual Visit Location Patient: Home Provider: Virtual Visit Location Provider: Home Office   I discussed the limitations of evaluation and management  by telemedicine and the availability of in person appointments. The patient expressed understanding and agreed to proceed.    History of Present Illness: Derrick Carlson is a 51 y.o. who identifies as a male who was assigned male at birth, and is being seen today for covid 55.  HPI: URI  This is a new problem. The current episode started yesterday (tested positive for covid 19 today; symptoms started yesterday). Maximum temperature: subjective fever. Associated symptoms include congestion, coughing, headaches, rhinorrhea (mild), sinus pain and a sore throat (scratchy). Pertinent negatives include no diarrhea, ear pain, nausea, plugged ear sensation or vomiting. Associated symptoms comments: Body aches, chills, fatigue. He has tried increased fluids (theraflu) for the symptoms. The treatment provided no relief.     Problems:  Patient Active Problem List   Diagnosis Date Noted   Foreign body of left knee 12/25/2018    Allergies: No Known Allergies Medications:  Current Outpatient Medications:    nirmatrelvir/ritonavir EUA (PAXLOVID) 20 x 150 MG & 10 x 100MG  TABS, Take 3 tablets by mouth 2 (two) times daily for 5 days. (Take nirmatrelvir 150 mg two tablets twice daily for 5 days and ritonavir 100 mg one tablet twice daily for 5 days) Patient GFR is greater than 60, Disp: 30 tablet, Rfl: 0   aspirin (ASPIRIN CHILDRENS) 81 MG chewable tablet, Chew 1 tablet (81 mg total)  by mouth 2 (two) times daily., Disp: 60 tablet, Rfl: 0   methocarbamol (ROBAXIN) 500 MG tablet, Take 1 tablet (500 mg total) by mouth every 6 (six) hours as needed., Disp: 40 tablet, Rfl: 1  Observations/Objective: Patient is well-developed, well-nourished in no acute distress.  Resting comfortably at home.  Head is normocephalic, atraumatic.  No labored breathing.  Speech is clear and coherent with logical content.  Patient is alert and oriented at baseline.    Assessment and Plan: 1. COVID-19 - nirmatrelvir/ritonavir EUA  (PAXLOVID) 20 x 150 MG & 10 x 100MG  TABS; Take 3 tablets by mouth 2 (two) times daily for 5 days. (Take nirmatrelvir 150 mg two tablets twice daily for 5 days and ritonavir 100 mg one tablet twice daily for 5 days) Patient GFR is greater than 60  Dispense: 30 tablet; Refill: 0  - Continue OTC symptomatic management of choice - Will send OTC vitamins and supplement information through AVS - Paxlovid prescribed - Patient enrolled in MyChart symptom monitoring - Push fluids - Rest as needed - Discussed return precautions and when to seek in-person evaluation, sent via AVS as well  Follow Up Instructions: I discussed the assessment and treatment plan with the patient. The patient was provided an opportunity to ask questions and all were answered. The patient agreed with the plan and demonstrated an understanding of the instructions.  A copy of instructions were sent to the patient via MyChart unless otherwise noted below.   The patient was advised to call back or seek an in-person evaluation if the symptoms worsen or if the condition fails to improve as anticipated.  Time:  I spent 15 minutes with the patient via telehealth technology discussing the above problems/concerns.    , PA-C

## 2021-04-08 NOTE — Patient Instructions (Signed)
Derrick Carlson, thank you for joining Margaretann Loveless, PA-C for today's virtual visit.  While this provider is not your primary care provider (PCP), if your PCP is located in our provider database this encounter information will be shared with them immediately following your visit.  Consent: (Patient) Derrick Carlson provided verbal consent for this virtual visit at the beginning of the encounter.  Current Medications:  Current Outpatient Medications:    nirmatrelvir/ritonavir EUA (PAXLOVID) 20 x 150 MG & 10 x 100MG  TABS, Take 3 tablets by mouth 2 (two) times daily for 5 days. (Take nirmatrelvir 150 mg two tablets twice daily for 5 days and ritonavir 100 mg one tablet twice daily for 5 days) Patient GFR is greater than 60, Disp: 30 tablet, Rfl: 0   aspirin (ASPIRIN CHILDRENS) 81 MG chewable tablet, Chew 1 tablet (81 mg total) by mouth 2 (two) times daily., Disp: 60 tablet, Rfl: 0   methocarbamol (ROBAXIN) 500 MG tablet, Take 1 tablet (500 mg total) by mouth every 6 (six) hours as needed., Disp: 40 tablet, Rfl: 1   Medications ordered in this encounter:  Meds ordered this encounter  Medications   nirmatrelvir/ritonavir EUA (PAXLOVID) 20 x 150 MG & 10 x 100MG  TABS    Sig: Take 3 tablets by mouth 2 (two) times daily for 5 days. (Take nirmatrelvir 150 mg two tablets twice daily for 5 days and ritonavir 100 mg one tablet twice daily for 5 days) Patient GFR is greater than 60    Dispense:  30 tablet    Refill:  0    Order Specific Question:   Supervising Provider    Answer:   [[id number]]     *If you need refills on other medications prior to your next appointment, please contact your pharmacy*  Follow-Up: Call back or seek an in-person evaluation if the symptoms worsen or if the condition fails to improve as anticipated.  Other Instructions Paxlovid-Nirmatrelvir; Ritonavir Tablets Qu es este medicamento? El NIRMATRELVIR; RITONAVIR trata la COVID-19. Es un medicamento  antiviral. Puede disminuir el riesgo de desarrollar sntomas graves por COVID-19. Tambin puede disminuir las probabilidades de tener que ir al hospital. medicamento no est aprobado por la FDA. La FDA ha autorizado el uso de emergencia de este medicamento durante la pandemia por COVID-19. Este medicamento puede ser utilizado para otros usos; si tiene alguna pregunta consulte con su proveedor de atencin mdica o con su farmacutico. MARCAS COMUNES: PAXLOVID Qu le debo informar a mi profesional de la salud antes de tomar este medicamento? Necesitan saber si usted presenta alguno de los siguientes problemas o situaciones: Cualquier Eber Hong enfermedad grave Enfermedad renal Enfermedad heptica Una reaccin alrgica o inusual al nirmatrelvir, al ritonavir, a otros medicamentos, alimentos, colorantes o conservantes Si est embarazada o buscando quedar embarazada Si est amamantando a un beb Cmo debo Cleda Clarks medicamento? Tome este medicamento por va oral con agua. selo segn las instrucciones en la etiqueta a la Ship broker. Trague las tabletas enteras. Puede tomarlo con o sin alimentos. Si el Visual merchandiser, tmelo con alimentos. Use todo este medicamento a menos que su equipo de [employer] diga que deje de usarlo antes de terminarlo. Siga usndolo incluso si cree que est mejor. Hable con su equipo de atencin sobre el uso de este medicamento en nios. Aunque se puede recetar a nios tan pequeos como de 12 aos de edad con ciertas afecciones, existen precauciones que deben tomarse. Sobredosis:  Pngase en contacto inmediatamente con un centro toxicolgico o una sala de urgencia si usted cree que haya tomado demasiado medicamento. ATENCIN: [employer] es solo para usted. No comparta este medicamento con nadie. Qu sucede si me olvido de una dosis? Si olvida una dosis, adminstrela lo antes posible, a menos que hayan pasado ms  de 8 horas. Si han pasado ms de 8 horas desde el horario de administracin habitual, omita la dosis que se olvid. Administre la prxima dosis a la hora habitual. No administre una dosis adicional ni 2 dosis a la vez para compensar la dosis que olvid. Qu puede interactuar con este medicamento? No use este medicamento con ninguno de los siguientes frmacos: Alfuzosina Ciertos medicamentos para la ansiedad o para dormir, tales como midazolam, triazolam Ciertos medicamentos para Management consultant, tales como apalutamida, enzalutamida Ciertos medicamentos para el colesterol, tales como lovastatina, simvastatina Ciertos medicamentos para la frecuencia cardiaca irregular, tales como amiodarona, dronedarona, flecainida, propafenona, quinidina Ciertos medicamentos para el dolor, tales como meperidina, piroxicam, propoxifeno Ciertos medicamentos para trastornos psicticos, tales como clozapina, lurasidona, pimozida Ciertos medicamentos para convulsiones, tales como carbamazepina, fenobarbital, fenitona Colchicina Alcaloides ergotnicos, tales como dihidroergotamina, ergonovina, ergotamina, metilergonovina Ranolazina Rifampicina Sildenafilo Hierba de [place] medicamento tambin puede Product/process development scientist con los siguientes medicamentos: Bedaquilina Pldoras anticonceptivas [place]ano Ciertos antibiticos, tales como eritromicina o claritromicina Ciertos medicamentos para la presin sangunea, tales como amlodipino, diltiazem, felodipino, nicardipino, nifedipino Ciertos medicamentos para el cncer, tales como abemaciclib, ceritinib, dasatinib, encorafenib, ibrutinib, ivosidenib, neratinib, nilotinib, venetoclax, vinblastina, vincristina Ciertos medicamentos para el colesterol, tales como atorvastatina, rosuvastatina Ciertos medicamentos para la depresin, tales como bupropin, trazodona Ciertos medicamentos para infecciones micticas, tales como isavuconazonio, itraconazol, ketoconazol, voriconazol Ciertos  medicamentos para la hepatitis C, tales como elbasvir; grazoprevir, dasabuvir; ombitasvir; paritaprevir; ritonavir, glecaprevir; pibrentasvir, sofosbuvir; velpatasvir; voxilaprevir Ciertos medicamentos para el VIH o SIDA Ciertos medicamentos para la frecuencia cardiaca irregular, tales como bepridil, lidocana Ciertos medicamentos que tratan o previenen cogulos sanguneos, tales como rivaroxabn y warfarina Digoxina Fentanilo Medicamentos que disminuyen sus posibilidades de combatir una infeccin, tales como ciclosporina, sirolims y tacrolims Metadona Quetiapina Rifabutina Salmeterol Medicamentos esteroideos, tales como budesonida, dexametasona, fluticasona, prednisona, triamcinolona Puede ser que esta lista no menciona todas las posibles interacciones. Informe a su profesional de [employer] de [employer] productos a base de hierbas, medicamentos de [place] o suplementos nutritivos que est tomando. Si usted fuma, consume bebidas alcohlicas o si utiliza drogas ilegales, indqueselo tambin a su profesional de [employer]. Algunas sustancias pueden interactuar con su medicamento. A qu debo estar atento al usar [employer]? Se supervisar su estado de salud atentamente mientras reciba este medicamento. Visite a su equipo de atencin para realizarse revisiones peridicas. Si los sntomas no comienzan a mejorar o si empeoran, consulte con su equipo de atencin. Si tiene una infeccin por VIH no tratada, este medicamento podra hacer que algunos medicamentos contra el VIH no funcionen bien en el futuro. Los anticonceptivos podran no Chief Strategy Officer est [employer]. Hable con su equipo de atencin [employer] de un mtodo anticonceptivo adicional. Qu efectos secundarios puedo tener al utilizar este medicamento? Efectos secundarios que debe informar a su equipo de atencin tan pronto como sea posible: Reacciones alrgicas: erupcin cutnea, comezn/picazn,  urticaria, hinchazn de la cara, los labios, la lengua o la garganta Lesin en el hgado: dolor en la regin abdominal superior derecha, prdida de apetito, nuseas, heces de color claro, orina amarilla oscura o marrn, color amarillento de los ojos o  la piel, debilidad o fatiga inusuales Efectos secundarios que generalmente no requieren atencin mdica (debe informarlos a su equipo de atencin si persisten o si son molestos): Corporate treasurer sentido del gusto Diarrea Aumento de la presin arterial Dolor muscular Puede ser que esta lista no menciona todos los posibles efectos secundarios. Comunquese a su mdico por asesoramiento mdico [employer]. Usted puede informar los efectos secundarios a la FDA por telfono al 1-800-FDA-1088. Dnde debo guardar mi medicina? Mantenga fuera del alcance de nios y Neurosurgeon. Guarde a [employer], entre 20 y 25 grados Celsius (68 y 26 grados Fahrenheit). Deseche todo el medicamento que no haya utilizado despus de la fecha de vencimiento. Para desechar los medicamentos que ya no necesite o que estn vencidos: Lleve el medicamento a un programa de recuperacin de medicamentos. Consulte con su farmacia o con una entidad reguladora para encontrar un lugar donde llevarlo. Si no puede devolver el medicamento, consulte la etiqueta o el folleto de informacin para ver si debe desecharlo en la basura o arrojarlo por el sanitario. Si no est seguro, consulte con su equipo de atencin. Si es seguro colocarlo en la basura, saque el medicamento del recipiente. Mezcle el medicamento con piedras sanitarias para gatos, tierra, posos (residuos) de caf u otro desperdicio. Coloque la [employer] bolsa o recipiente que quede [place]. Deseche en la basura. ATENCIN: Este folleto es un resumen. Puede ser que no cubra toda la posible informacin. Si usted tiene preguntas acerca de esta medicina, consulte con su mdico, su farmacutico o su profesional de  Radiographer, therapeutic.  2022 Elsevier/Gold Standard (2020-09-16 00:00:00)    If you have been instructed to have an in-person evaluation today at a local Urgent Care facility, please use the link below. It will take you to a list of all of our available [facility], including address, phone number and hours of operation. Please do not delay care.  [facility]  If you or a family member do not have a primary care provider, use the link below to schedule a visit and establish care. When you choose a [facility] primary care physician or advanced practice provider, you gain a long-term partner in health. Find a Primary Care Provider  Learn more about [facility]'s in-office and virtual care options:  - Get Care Now

## 2021-12-13 ENCOUNTER — Other Ambulatory Visit: Payer: Self-pay

## 2021-12-13 ENCOUNTER — Ambulatory Visit (HOSPITAL_COMMUNITY)
Admission: EM | Admit: 2021-12-13 | Discharge: 2021-12-13 | Disposition: A | Discharge status: Home / Self Care | Payer: Self-pay | Attending: Family Medicine | Admitting: Family Medicine

## 2021-12-13 ENCOUNTER — Encounter (HOSPITAL_COMMUNITY): Payer: Self-pay

## 2021-12-13 ENCOUNTER — Emergency Department (HOSPITAL_BASED_OUTPATIENT_CLINIC_OR_DEPARTMENT_OTHER): Payer: Self-pay | Admitting: Radiology

## 2021-12-13 ENCOUNTER — Emergency Department (HOSPITAL_BASED_OUTPATIENT_CLINIC_OR_DEPARTMENT_OTHER)
Admission: EM | Admit: 2021-12-13 | Discharge: 2021-12-13 | Disposition: A | Discharge status: Home / Self Care | Payer: Self-pay | Attending: Emergency Medicine | Admitting: Emergency Medicine

## 2021-12-13 ENCOUNTER — Encounter (HOSPITAL_BASED_OUTPATIENT_CLINIC_OR_DEPARTMENT_OTHER): Payer: Self-pay | Admitting: Emergency Medicine

## 2021-12-13 DIAGNOSIS — Z7982 Long term (current) use of aspirin: Secondary | ICD-10-CM | POA: Insufficient documentation

## 2021-12-13 DIAGNOSIS — T17208A Unspecified foreign body in pharynx causing other injury, initial encounter: Secondary | ICD-10-CM

## 2021-12-13 DIAGNOSIS — R051 Acute cough: Secondary | ICD-10-CM

## 2021-12-13 DIAGNOSIS — J101 Influenza due to other identified influenza virus with other respiratory manifestations: Secondary | ICD-10-CM | POA: Insufficient documentation

## 2021-12-13 DIAGNOSIS — Z20822 Contact with and (suspected) exposure to covid-19: Secondary | ICD-10-CM | POA: Insufficient documentation

## 2021-12-13 LAB — RESP PANEL BY RT-PCR (FLU A&B, COVID) ARPGX2
Influenza A by PCR: POSITIVE — AB
Influenza B by PCR: NEGATIVE
SARS Coronavirus 2 by RT PCR: NEGATIVE

## 2021-12-13 LAB — GROUP A STREP BY PCR: Group A Strep by PCR: NOT DETECTED

## 2021-12-13 MED ORDER — ACETAMINOPHEN 325 MG PO TABS
650.0000 mg | ORAL_TABLET | Freq: Once | ORAL | Status: AC
  Administered 2021-12-13: 650 mg via ORAL
  Filled 2021-12-13: qty 2

## 2021-12-13 MED ORDER — OSELTAMIVIR PHOSPHATE 75 MG PO CAPS: 75.0000 mg | ORAL_CAPSULE | Freq: Two times a day (BID) | ORAL | 0 refills | Status: AC

## 2021-12-13 NOTE — ED Triage Notes (Signed)
Swallowed piece of plastic 2 weeks ago.  Now feels sore throat has gotten worse, has cough and congestion. Able to swallow and protecting airway.  Symptoms improve with eating but return after

## 2021-12-13 NOTE — Discharge Instructions (Addendum)
Please proceed to the emergency room.

## 2021-12-13 NOTE — ED Provider Triage Note (Signed)
Emergency Medicine Provider Triage Evaluation Note  Derrick Carlson , a 51 y.o. male  was evaluated in triage.  Pt complains of fever and shortness of breath.  Pt inhaled a piece of plastic 2 weeks ago.   Review of Systems  Positive: Fever cough Negative: Chest pain  Physical Exam  BP (!) 126/101 (BP Location: Right Arm)   Pulse (!) 105   Temp (!) 100.6 F (38.1 C)   Resp 18   SpO2 97%  Gen:   Awake, no distress   Resp:  Normal effort  MSK:   Moves extremities without difficulty  Other:    Medical Decision Making  Medically screening exam initiated at 7:07 PM.  Appropriate orders placed.  Derrick Carlson was informed that the remainder of the evaluation will be completed by another provider, this initial triage assessment does not replace that evaluation, and the importance of remaining in the ED until their evaluation is complete.     Fransico Meadow, [place] 12/13/21 1909

## 2021-12-13 NOTE — ED Triage Notes (Signed)
Pt swallowed a plastic object x2wks ago, cough and fever x2days

## 2021-12-13 NOTE — ED Provider Notes (Addendum)
[facility]    CSN: [id number] Arrival date & time: 12/13/21  1628      History   Chief Complaint Chief Complaint  Patient presents with   Cough   Swallowed Foreign Body    HPI Derrick Aprea Allene Carlson is a 51 y.o. male.    Cough Swallowed Foreign Body   Here for fever and cough and shortness of breath.  Also here for foreign body sensation in his throat.  About 2 weeks ago he was opening up a package with screws in it and the plastic packaging tore loose and he inhaled a piece of it into his throat.  He feels that it has been there since this happened on the left side of his throat.  He can feel it move around some when he breathes in.  He feels that sometimes when he has eaten or swallowed something it feels better.  Now in the last day or 2 he has fever and cough.  No vomiting   History reviewed. No pertinent past medical history.  Patient Active Problem List   Diagnosis Date Noted   Foreign body of left knee 12/25/2018    Past Surgical History:  Procedure Laterality Date   KNEE ARTHROSCOPY Left 12/25/2018   Procedure: Left knee I+D arthroscopic and removal of loose body (nail) from distal femur;  Surgeon: Beverely Low, MD;  Location: [facility] OR;  Service: Orthopedics;  Laterality: Left;       Home Medications    Prior to Admission medications   Medication Sig Start Date End Date Taking? Authorizing Provider  aspirin (ASPIRIN CHILDRENS) 81 MG chewable tablet Chew 1 tablet (81 mg total) by mouth 2 (two) times daily. 12/25/18   Beverely Low, MD  methocarbamol (ROBAXIN) 500 MG tablet Take 1 tablet (500 mg total) by mouth every 6 (six) hours as needed. 12/25/18   Beverely Low, MD    Family History History reviewed. No pertinent family history.  Social History Social History   Tobacco Use   Smoking status: Never   Smokeless tobacco: Never  Vaping Use   Vaping Use: Never used  Substance Use Topics   Alcohol use: Yes    Comment: sometimes   Drug  use: No     Allergies   Patient has no known allergies.   Review of Systems Review of Systems  Respiratory:  Positive for cough.      Physical Exam Triage Vital Signs ED Triage Vitals  Enc Vitals Group     BP 12/13/21 1729 133/83     Pulse Rate 12/13/21 1729 (!) 116     Resp 12/13/21 1729 16     Temp 12/13/21 1729 100 F (37.8 C)     Temp src --      SpO2 12/13/21 1729 95 %     Weight --      Height --      Head Circumference --      Peak Flow --      Pain Score 12/13/21 1727 0     Pain Loc --      Pain Edu? --      Excl. in GC? --    No data found.  Updated Vital Signs BP 133/83 (BP Location: Left Arm)   Pulse (!) 116   Temp 100 F (37.8 C)   Resp 16   SpO2 95%   Visual Acuity Right Eye Distance:   Left Eye Distance:   Bilateral Distance:    Right Eye Near:  Left Eye Near:    Bilateral Near:     Physical Exam Vitals reviewed.  Constitutional:      General: He is not in acute distress.    Appearance: He is not ill-appearing, toxic-appearing or diaphoretic.  HENT:     Mouth/Throat:     Mouth: Mucous membranes are moist.     Pharynx: No oropharyngeal exudate or posterior oropharyngeal erythema.     Comments: With limited view I have looking with a light, I cannot see any foreign body Cardiovascular:     Rate and Rhythm: Normal rate and regular rhythm.  Pulmonary:     Effort: Pulmonary effort is normal. No respiratory distress.     Breath sounds: No stridor. No wheezing, rhonchi or rales.  Skin:    Coloration: Skin is not jaundiced or pale.  Neurological:     General: No focal deficit present.     Mental Status: He is alert and oriented to person, place, and time.  Psychiatric:        Behavior: Behavior normal.      UC Treatments / Results  Labs (all labs ordered are listed, but only abnormal results are displayed) Labs Reviewed - No data to display  EKG   Radiology No results found.  Procedures Procedures (including critical  care time)  Medications Ordered in UC Medications - No data to display  Initial Impression / Assessment and Plan / UC Course  I have reviewed the triage vital signs and the nursing notes.  Pertinent labs & imaging results that were available during my care of the patient were reviewed by me and considered in my medical decision making (see chart for details).           I discussed with him that I could do plain x-rays here, but I doubt that would show the foreign body in his throat.  It may be that the fever and cough are totally unrelated to this other complaint, but I have asked him to go to the emergency room for further evaluation Final Clinical Impressions(s) / UC Diagnoses   Final diagnoses:  None   Discharge Instructions   None    ED Prescriptions   None    PDMP not reviewed this encounter.   Barrett Henle, MD 12/13/21 1759    Barrett Henle, MD 12/13/21 1800

## 2021-12-21 NOTE — ED Provider Notes (Signed)
[facility] EM[facility] DEPT Provider Note   CSN: [id number] Arrival date & time: 12/13/21  1826     History  Chief Complaint  Patient presents with   Sore Throat    Derrick Carlson Monday is a 51 y.o. male.  Pt complains of a cough.  Pt reports he swallowed a piece of plastic from a screw package 3 weeks ago.  Pt reports he felt like he was choking.  Pt complains of a fever now.  Pt has a cough and congestion  The history is provided by the patient. No language interpreter was used.  Sore Throat       Home Medications Prior to Admission medications   Medication Sig Start Date End Date Taking? Authorizing Provider  aspirin (ASPIRIN CHILDRENS) 81 MG chewable tablet Chew 1 tablet (81 mg total) by mouth 2 (two) times daily. 12/25/18   Netta Cedars, MD  methocarbamol (ROBAXIN) 500 MG tablet Take 1 tablet (500 mg total) by mouth every 6 (six) hours as needed. 12/25/18   Netta Cedars, MD      Allergies    Patient has no known allergies.    Review of Systems   Review of Systems  Respiratory:  Positive for cough.   All other systems reviewed and are negative.   Physical Exam Updated Vital Signs BP (!) 150/98   Pulse (!) 101   Temp (!) 102.1 F (38.9 C) (Axillary) Comment: EDP aware  Resp 18   SpO2 94%  Physical Exam Vitals and nursing note reviewed.  Constitutional:      Appearance: He is well-developed.  HENT:     Head: Normocephalic.     Right Ear: Tympanic membrane normal.     Left Ear: Tympanic membrane normal.     Mouth/Throat:     Mouth: Mucous membranes are moist.  Cardiovascular:     Rate and Rhythm: Normal rate.  Pulmonary:     Effort: Pulmonary effort is normal.  Abdominal:     General: There is no distension.  Musculoskeletal:        General: Normal range of motion.     Cervical back: Normal range of motion.  Neurological:     Mental Status: He is alert and oriented to person, place, and time.     ED Results / Procedures /  Treatments   Labs (all labs ordered are listed, but only abnormal results are displayed) Labs Reviewed  RESP PANEL BY RT-PCR (FLU A&B, COVID) ARPGX2 - Abnormal; Notable for the following components:      Result Value   Influenza A by PCR POSITIVE (*)    All other components within normal limits  GROUP A STREP BY PCR    EKG None  Radiology No results found.  Procedures Procedures    Medications Ordered in ED Medications  acetaminophen (TYLENOL) tablet 650 mg (650 mg Oral Given 12/13/21 2210)    ED Course/ Medical Decision Making/ A&P                           Medical Decision Making Pt complains of cough and congestion   Amount and/or Complexity of Data Reviewed Independent Historian: spouse Labs: ordered. Decision-making details documented in ED Course.    Details: Labs ordered reviewed and interpreted.  Influenza is positive  Radiology: ordered and independent interpretation performed. Decision-making details documented in ED Course.    Details: Chest xray is negative   Risk OTC drugs. Prescription drug management.  Final Clinical Impression(s) / ED Diagnoses Final diagnoses:  Influenza A    Rx / DC Orders ED Discharge Orders          Ordered    oseltamivir (TAMIFLU) 75 MG capsule  2 times daily        12/13/21 2200           An After Visit Summary was printed and given to the patient.    Elson Areas, PA-C 12/21/21 1528    Franne Forts, DO 12/30/21 [phone]

## 2022-03-05 ENCOUNTER — Emergency Department (HOSPITAL_COMMUNITY)
Admission: EM | Admit: 2022-03-05 | Discharge: 2022-03-05 | Disposition: A | Discharge status: Home / Self Care | Payer: Self-pay | Attending: Emergency Medicine | Admitting: Emergency Medicine

## 2022-03-05 ENCOUNTER — Other Ambulatory Visit: Payer: Self-pay

## 2022-03-05 DIAGNOSIS — Z7982 Long term (current) use of aspirin: Secondary | ICD-10-CM | POA: Insufficient documentation

## 2022-03-05 DIAGNOSIS — K0889 Other specified disorders of teeth and supporting structures: Secondary | ICD-10-CM | POA: Insufficient documentation

## 2022-03-05 DIAGNOSIS — R6884 Jaw pain: Secondary | ICD-10-CM | POA: Insufficient documentation

## 2022-03-05 MED ORDER — OXYCODONE-ACETAMINOPHEN 5-325 MG PO TABS
1.0000 | ORAL_TABLET | Freq: Once | ORAL | Status: AC
  Administered 2022-03-05: 1 via ORAL
  Filled 2022-03-05: qty 1

## 2022-03-05 MED ORDER — CHLORHEXIDINE GLUCONATE 0.12 % MT SOLN: 15.0000 mL | Freq: Two times a day (BID) | OROMUCOSAL | 0 refills | Status: AC

## 2022-03-05 MED ORDER — ACETAMINOPHEN 325 MG PO TABS
650.0000 mg | ORAL_TABLET | ORAL | Status: AC
  Administered 2022-03-05: 650 mg via ORAL
  Filled 2022-03-05: qty 2

## 2022-03-05 MED ORDER — AMOXICILLIN-POT CLAVULANATE 875-125 MG PO TABS
1.0000 | ORAL_TABLET | Freq: Once | ORAL | Status: AC
  Administered 2022-03-05: 1 via ORAL
  Filled 2022-03-05: qty 1

## 2022-03-05 MED ORDER — AMOXICILLIN-POT CLAVULANATE 875-125 MG PO TABS: 1.0000 | ORAL_TABLET | Freq: Two times a day (BID) | ORAL | 0 refills | Status: AC

## 2022-03-05 NOTE — Discharge Instructions (Signed)
To [facility] for the entire course, call tomorrow to see if you can talk to an on-call provider for the dentist office.  If you not able to get in touch with him tomorrow call immediately Monday morning.  Return to the emergency room for any fevers, new or concerning symptoms.

## 2022-03-05 NOTE — ED Triage Notes (Signed)
Patient reports right lower dental extraction 3 days ago with persistent pain and swelling unrelieved by OTC pain medications.

## 2022-03-05 NOTE — ED Provider Notes (Signed)
Marland Kitchen [facility] EMERGENCY DEPARTMENT Provider Note   CSN: [id number] Arrival date & time: 03/05/22  2133     History   Chief Complaint Chief Complaint  Patient presents with   Dental Pain     HPI Derrick Carlson is a 52 y.o. male.  Patient presents to the emergency department with a dental complaint. Symptoms began 3 days ago. The patient has tried to alleviate pain with tyelnol and ibuprofen.  Pain rated as severe, characterized as throbbing in nature and located right lower jaw. Patient denies fever, night sweats, chills, difficulty swallowing or opening mouth, SOB, nuchal rigidity or decreased ROM of neck.  Patient does not have a dentist and requests a resource guide at discharge.  Patient had a tooth removed 3 days ago at the dentist office but states that he has had severe consistent pain since.  Would like him a pain medicine other than Tylenol and ibuprofen.  He denies any fevers no difficulty opening or closing his mouth but states that his ability to get is still painful and the pain is keeping up at night.    HPI  No past medical history on file.  Patient Active Problem List   Diagnosis Date Noted   Foreign body of left knee 12/25/2018    Past Surgical History:  Procedure Laterality Date   KNEE ARTHROSCOPY Left 12/25/2018   Procedure: Left knee I+D arthroscopic and removal of loose body (nail) from distal femur;  Surgeon: Beverely Low, MD;  Location: [facility] OR;  Service: Orthopedics;  Laterality: Left;        Home Medications    Prior to Admission medications   Medication Sig Start Date End Date Taking? Authorizing Provider  amoxicillin-clavulanate (AUGMENTIN) 875-125 MG tablet Take 1 tablet by mouth every 12 (twelve) hours. 03/05/22  Yes Marguita Venning S, PA  chlorhexidine (PERIDEX) 0.12 % solution Use as directed 15 mLs in the mouth or throat 2 (two) times daily. 03/05/22  Yes Anahli Arvanitis, Stevphen Meuse S, PA  aspirin (ASPIRIN CHILDRENS) 81 MG chewable tablet  Chew 1 tablet (81 mg total) by mouth 2 (two) times daily. 12/25/18   Beverely Low, MD  methocarbamol (ROBAXIN) 500 MG tablet Take 1 tablet (500 mg total) by mouth every 6 (six) hours as needed. 12/25/18   Beverely Low, MD    Family History No family history on file.  Social History Social History   Tobacco Use   Smoking status: Never   Smokeless tobacco: Never  Vaping Use   Vaping Use: Never used  Substance Use Topics   Alcohol use: Yes    Comment: sometimes   Drug use: No     Allergies   Patient has no known allergies.   Review of Systems Denies fevers, chills, difficulty swallowing or eating, changes in voice, pain under tongue, nausea, vomiting, lightheadedness or dizziness. No trismus   Physical Exam Updated Vital Signs BP (!) 152/91 (BP Location: Right Arm)   Pulse 83   Temp 98.6 F (37 C) (Oral)   Resp 14   SpO2 94%   Physical Exam Physical Exam  Constitutional: Pt appears well-developed and well-nourished.  HENT:  Head: Normocephalic.  Right Ear: Tympanic membrane, external ear and ear canal normal.  Left Ear: Tympanic membrane, external ear and ear canal normal.  Nose: Nose normal. Right sinus exhibits no maxillary sinus tenderness and no frontal sinus tenderness. Left sinus exhibits no maxillary sinus tenderness and no frontal sinus tenderness.  Mouth/Throat: Uvula is midline, oropharynx is  clear and moist and mucous membranes are normal. No oral lesions. No uvula swelling or lacerations. No oropharyngeal exudate, posterior oropharyngeal edema, posterior oropharyngeal erythema or tonsillar abscesses.  Poor dentition No gingival swelling, fluctuance or induration No gross abscess  No sublingual edema, tenderness to palpation, or sign of Ludwig's angina, or deep space infection Pain at lower dentition, R lower lateral incisor removed Eyes: Conjunctivae are normal. Pupils are equal, round, and reactive to light. Right eye exhibits no discharge. Left eye  exhibits no discharge.  Neck: Normal range of motion. Neck supple.  No stridor Handling secretions without difficulty No nuchal rigidity No cervical lymphadenopathy Cardiovascular: Normal rate, regular rhythm and normal heart sounds.   Pulmonary/Chest: Effort normal. No respiratory distress.  Equal chest rise  Abdominal: Soft. Bowel sounds are normal. Pt exhibits no distension. There is no tenderness.  Lymphadenopathy: Pt has no cervical adenopathy.  Neurological: Pt is alert and oriented x 4  Skin: Skin is warm and dry.  Psychiatric: Pt has a normal mood and affect.  Nursing note and vitals reviewed.   ED Treatments / Results  Labs (all labs ordered are listed, but only abnormal results are displayed) Labs Reviewed - No data to display  EKG    Radiology No results found.  Procedures Procedures (including critical care time)  Medications Ordered in ED Medications  amoxicillin-clavulanate (AUGMENTIN) 875-125 MG per tablet 1 tablet (has no administration in time range)  oxyCODONE-acetaminophen (PERCOCET/ROXICET) 5-325 MG per tablet 1 tablet (has no administration in time range)  acetaminophen (TYLENOL) tablet 650 mg (has no administration in time range)     Initial Impression / Assessment and Plan / ED Course  I have reviewed the triage vital signs and the nursing notes.  Pertinent labs & imaging results that were available during my care of the patient were reviewed by me and considered in my medical decision making (see chart for details).        Patient with dentalgia.  No abscess requiring immediate incision and drainage.  Exam not concerning for Ludwig's angina or pharyngeal abscess.  Will treat with Augmentin, Peridex. Pt instructed to follow-up with his dentist who will need to see him expediently.  Discussed return precautions. Pt safe for discharge.  However he will need to return to emergency room for any new or concerning symptoms.   Final Clinical  Impressions(s) / ED Diagnoses   Final diagnoses:  Pain, dental    ED Discharge Orders          Ordered    amoxicillin-clavulanate (AUGMENTIN) 875-125 MG tablet  Every 12 hours        03/05/22 2310    chlorhexidine (PERIDEX) 0.12 % solution  2 times daily        03/05/22 2310              Pati Gallo [place], [place] 03/05/22 2313    Sherwood Gambler, MD 03/06/22 [phone]
